# Patient Record
Sex: Female | Born: 1955 | Race: White | Hispanic: No | Smoking: Former smoker
Health system: Southern US, Community
[De-identification: ages and names within clinical notes are randomized; demographics above are authoritative.]

## PROBLEM LIST (undated history)

## (undated) DIAGNOSIS — T7840XA Allergy, unspecified, initial encounter: Secondary | ICD-10-CM

## (undated) DIAGNOSIS — F5081 Binge eating disorder: Secondary | ICD-10-CM

## (undated) DIAGNOSIS — J4520 Mild intermittent asthma, uncomplicated: Secondary | ICD-10-CM

## (undated) DIAGNOSIS — R7301 Impaired fasting glucose: Secondary | ICD-10-CM

## (undated) DIAGNOSIS — D3500 Benign neoplasm of unspecified adrenal gland: Secondary | ICD-10-CM

## (undated) DIAGNOSIS — F50819 Binge eating disorder, unspecified: Secondary | ICD-10-CM

## (undated) DIAGNOSIS — R112 Nausea with vomiting, unspecified: Secondary | ICD-10-CM

## (undated) DIAGNOSIS — C439 Malignant melanoma of skin, unspecified: Secondary | ICD-10-CM

## (undated) DIAGNOSIS — I10 Essential (primary) hypertension: Secondary | ICD-10-CM

## (undated) DIAGNOSIS — Z860100 Personal history of colon polyps, unspecified: Secondary | ICD-10-CM

## (undated) DIAGNOSIS — F429 Obsessive-compulsive disorder, unspecified: Secondary | ICD-10-CM

## (undated) DIAGNOSIS — I2721 Secondary pulmonary arterial hypertension: Secondary | ICD-10-CM

## (undated) DIAGNOSIS — T753XXA Motion sickness, initial encounter: Secondary | ICD-10-CM

## (undated) DIAGNOSIS — E785 Hyperlipidemia, unspecified: Secondary | ICD-10-CM

## (undated) DIAGNOSIS — J45909 Unspecified asthma, uncomplicated: Secondary | ICD-10-CM

## (undated) DIAGNOSIS — Z9889 Other specified postprocedural states: Secondary | ICD-10-CM

## (undated) DIAGNOSIS — Z780 Asymptomatic menopausal state: Secondary | ICD-10-CM

## (undated) DIAGNOSIS — G4700 Insomnia, unspecified: Secondary | ICD-10-CM

## (undated) DIAGNOSIS — R600 Localized edema: Secondary | ICD-10-CM

## (undated) DIAGNOSIS — Z973 Presence of spectacles and contact lenses: Secondary | ICD-10-CM

## (undated) DIAGNOSIS — K760 Fatty (change of) liver, not elsewhere classified: Secondary | ICD-10-CM

## (undated) DIAGNOSIS — Z87898 Personal history of other specified conditions: Secondary | ICD-10-CM

## (undated) HISTORY — DX: Obsessive-compulsive disorder, unspecified: F42.9

## (undated) HISTORY — PX: MELANOMA EXCISION: SHX5266

## (undated) HISTORY — DX: Insomnia, unspecified: G47.00

## (undated) HISTORY — DX: Hyperlipidemia, unspecified: E78.5

## (undated) HISTORY — PX: CYSTECTOMY: SUR359

## (undated) HISTORY — DX: Binge eating disorder, unspecified: F50.819

## (undated) HISTORY — DX: Personal history of other specified conditions: Z87.898

## (undated) HISTORY — DX: Malignant melanoma of skin, unspecified: C43.9

## (undated) HISTORY — DX: Binge eating disorder: F50.81

## (undated) HISTORY — DX: Unspecified asthma, uncomplicated: J45.909

## (undated) HISTORY — DX: Personal history of colonic polyps: Z86.010

## (undated) HISTORY — DX: Fatty (change of) liver, not elsewhere classified: K76.0

## (undated) HISTORY — DX: Impaired fasting glucose: R73.01

## (undated) HISTORY — DX: Secondary pulmonary arterial hypertension: I27.21

## (undated) HISTORY — DX: Allergy, unspecified, initial encounter: T78.40XA

## (undated) HISTORY — DX: Personal history of colon polyps, unspecified: Z86.0100

## (undated) HISTORY — DX: Mild intermittent asthma, uncomplicated: J45.20

## (undated) HISTORY — DX: Asymptomatic menopausal state: Z78.0

## (undated) HISTORY — PX: DIAGNOSTIC LAPAROSCOPY: SUR761

## (undated) HISTORY — DX: Benign neoplasm of unspecified adrenal gland: D35.00

---

## 2008-08-16 HISTORY — PX: BREAST BIOPSY: SHX20

## 2010-06-24 ENCOUNTER — Observation Stay: Payer: Self-pay | Admitting: Internal Medicine

## 2010-06-24 ENCOUNTER — Ambulatory Visit: Payer: Self-pay | Admitting: Internal Medicine

## 2011-08-12 ENCOUNTER — Ambulatory Visit: Payer: Self-pay | Admitting: Family Medicine

## 2011-08-17 HISTORY — PX: CHOLECYSTECTOMY: SHX55

## 2011-10-06 ENCOUNTER — Ambulatory Visit: Payer: Self-pay | Admitting: General Surgery

## 2011-10-07 LAB — PATHOLOGY REPORT

## 2011-10-11 ENCOUNTER — Ambulatory Visit: Payer: Self-pay | Admitting: General Surgery

## 2012-06-16 ENCOUNTER — Ambulatory Visit: Payer: Self-pay | Admitting: Unknown Physician Specialty

## 2012-06-16 HISTORY — PX: COLONOSCOPY: SHX174

## 2012-06-19 LAB — PATHOLOGY REPORT

## 2012-06-23 LAB — HM COLONOSCOPY

## 2012-08-06 ENCOUNTER — Ambulatory Visit: Payer: Self-pay | Admitting: Internal Medicine

## 2012-08-11 ENCOUNTER — Ambulatory Visit: Payer: Self-pay | Admitting: Family Medicine

## 2012-09-15 ENCOUNTER — Ambulatory Visit: Payer: Self-pay | Admitting: Family Medicine

## 2013-07-18 ENCOUNTER — Ambulatory Visit: Payer: Self-pay | Admitting: Family Medicine

## 2014-05-24 ENCOUNTER — Ambulatory Visit: Payer: Self-pay | Admitting: Family Medicine

## 2014-09-25 ENCOUNTER — Observation Stay: Payer: Self-pay | Admitting: Internal Medicine

## 2014-12-08 NOTE — Op Note (Signed)
PATIENT NAME:  Julie Ayala, Julie Ayala MR#:  [id number] DATE OF BIRTH:  [birth year]  DATE OF PROCEDURE:  10/06/2011  PREOPERATIVE DIAGNOSIS: Chronic cholecystitis and cholelithiasis.   POSTOPERATIVE DIAGNOSIS: Chronic cholecystitis and cholelithiasis.   OPERATIVE PROCEDURE: Laparoscopic cholecystectomy with intraoperative cholangiograms.   SURGEON: Robert Bellow, MD  ANESTHESIA: General endotracheal under Dr. Boston Service.   ESTIMATED BLOOD LOSS: Less than 5 mL.   CLINICAL NOTE: This 59 year old woman has had recurrent episodes of abdominal pain and ultrasound shows evidence of cholelithiasis. She was felt to be a candidate for elective cholecystectomy.   DESCRIPTION OF PROCEDURE: With the patient under adequate general endotracheal anesthesia, the abdomen was prepped with ChloraPrep and draped. In Trendelenburg position, a Veress needle was placed through a transumbilical incision. After assuring intra-abdominal location with the hanging drop test, the abdomen was insufflated with CO2 at 10 mmHg pressure. A 10 mm step port was expanded and inspection showed no evidence of injury from initial port placement. With the patient in reverse Trendelenburg position and rolled to the left an 11 mm Xcel port was placed in the epigastrium and two 5 mm step ports placed on the right side of the abdomen. There was noted to be chronic scarring about the gallbladder. The gallbladder was placed on cephalad traction and the adhesions between the omentum and the gallbladder were taken down with cautery dissection. The neck of the gallbladder was cleared and cystic duct and cystic artery were identified. A Kumar clamp was placed and 40 mL of one-half strength Conray 60 was used for cholangiograms. There was a small cystic duct and slow trickle into the common hepatic duct showing the bifurcation and then visualization of the distal common bile duct and flow into the duodenum. The films were suboptimal but there was little  suspicion for stone disease and with free flow and visualization of both the hepatic and common bile duct further attempts at cholangiography was not taken. The cystic duct and cystic artery branches were divided with clips and the gallbladder removed from the liver bed making use of hook cautery dissection. This was then delivered through the umbilical port site. Inspection from the epigastric site showed no evidence of injury from initial port placement. The right upper quadrant was irrigated with lactated Ringer's solution. Final inspection showed good hemostasis. The abdomen was then desufflated and ports removed under direct vision.   Skin incisions were closed with 4-0 Vicryl subcuticular sutures. Benzoin, Steri-Strips, Telfa, and Tegaderm dressings were applied.   Patient tolerated procedure well and was taken to recovery room in stable condition.   ____________________________ Robert Bellow, MD jwb:cms D: 10/06/2011 08:50:37 ET T: 10/06/2011 09:07:47 ET JOB#: [id number]  cc: Robert Bellow, MD, <Dictator> Olene Craven. Owens Shark, DO Timm Bonenberger Amedeo Kinsman MD ELECTRONICALLY SIGNED 10/06/2011 13:06

## 2014-12-15 NOTE — H&P (Signed)
PATIENT NAME:  Julie Ayala, Julie Ayala MR#:  [id number] DATE OF BIRTH:  April 08, 1956  DATE OF ADMISSION:  09/24/2014  PRIMARY CARE PROVIDER: [facility].   CHIEF COMPLAINT: Chest pain.    HISTORY OF PRESENT ILLNESS: A 59 year old female patient with history of asthma presents to the Emergency Room complaining of left-sided chest pain radiating to the left shoulder. This started yesterday around 11:30 p.m., which woke her up from sleep. She was unable to sleep well. Today, it got worse when she parked her car and climbed around 50 stairs. The patient presents to the Emergency Room. Presently, her pain is almost resolved. She had some tingling and numbness of her left hand briefly, which has resolved. EKG looks normal. Troponin is normal. She does not have any tachycardia. No hypoxia. Had some mild nausea, but no vomiting. No sweating.   The patient mentions that she was supposed to be started on medication for her binge eating by her family provider. For this an EKG was done which showed some abnormality for which it was repeated the next day and this abnormality persisted. The patient has a followup appointment with Dr. Clayborn Bigness on 09/27/2014. Today in the Emergency Room, the patient's EKG is normal. The patient did have a stress test in 2011, which was normal.   PAST MEDICAL HISTORY:  1.  Asthma.  2.  Pancreatitis.  3.  Cholecystectomy.  4.  Melanoma resection.   FAMILY HISTORY: Hypertension and pacemaker placement in her mother. No premature CAD.    SOCIAL HISTORY: The patient does not smoke. No alcohol. No illicit drug use. Works at [employer]: [place].   HOME MEDICATIONS: Albuterol inhaler q. 4 hours as needed for asthma.   REVIEW OF SYSTEMS:  CONSTITUTIONAL: No fever, fatigue.  EYES: No blurred vision, pain or redness.  EAR, NOSE, AND THROAT: No tinnitus, ear pain, hearing loss.  RESPIRATORY: No cough, wheeze, hemoptysis. Has asthma.  CARDIOVASCULAR: Had  chest pain.  GASTROINTESTINAL: No nausea, vomiting, diarrhea, abdominal pain.  GENITOURINARY: No dysuria, hematuria, frequency.  ENDOCRINE: No polyuria, nocturia, thyroid problems.  HEMATOLOGIC AND LYMPHATIC: No anemia, easy bruising, bleeding.  INTEGUMENTARY: No acne, rash, lesion.  MUSCULOSKELETAL: No back pain, arthritis.  NEUROLOGIC: No focal numbness, weakness.  PSYCHIATRIC: Has some anxiety.   PHYSICAL EXAMINATION:  VITAL SIGNS: Shows temperature 98. Initially, blood pressure was elevated at 164/91, presently at 115/67; saturating 97% on room air and pulse 72.  GENERAL: Obese Caucasian female patient lying in bed, overall seems comfortable, conversational, cooperative with exam.  PSYCHIATRIC: Alert and oriented x 3. Mood and affect appropriate. Judgment intact.  HEENT: Atraumatic, normocephalic. Oral mucosa moist and pink. External ears and nose normal. No pallor. No icterus. Pupils bilaterally equal and reactive to light.  NECK: Supple. No thyromegaly palpable. Trachea midline. No JVD.  CARDIOVASCULAR: S1, S2, without any murmurs. Peripheral pulses 2+. No edema. No chest wall tenderness.  GASTROINTESTINAL: Soft abdomen, nontender. Bowel sounds present. SKIN: Warm and dry. No petechiae, rash, ulcers.  MUSCULOSKELETAL: No joint swelling, redness, effusion of the large joints. Normal muscle tone.  NEUROLOGICAL: Motor strength 5/5 in upper and lower extremities. Sensation to fine touch intact all over.  LYMPHATIC: No cervical lymphadenopathy.   LABORATORY STUDIES: Troponin less than 0.02. D-dimer pending. Glucose 127, BUN 17, creatinine 0.95. WBC 7.2, hemoglobin 14.2, platelets of 301,000.   EKG shows normal sinus rhythm, nothing acute.   IMAGING: Chest x-ray, PA and lateral, shows no acute cardiopulmonary disease.  ASSESSMENT AND PLAN:  1.  Chest pain radiating to the left shoulder in a patient with no risk factors. The patient did have some abnormality of EKG, per her history at  her primary care physician's office, presently EKG seems normal. We will admit the patient for a stress test tomorrow. Depending on the stress just results, we might have to consult Dr. Clayborn Bigness or she can follow up in his office on 09/27/2014. Check 2 more sets of cardiac enzymes. Nitroglycerin p.r.n. The patient's symptoms could also be gastroesophageal reflux disease if her stress test is negative.  2.  Asthma: Continue her inhalers. No wheezing on exam.  3.  Deep vein thrombosis prophylaxis: Ambulatory, sequential compression devices.   CODE STATUS: Full code.   TIME SPENT TODAY ON THIS CASE: 45 minutes.    ____________________________ Leia Alf Babette Stum, MD srs:bm D: 09/25/2014 00:35:21 ET T: 09/25/2014 00:48:35 ET JOB#: [id number]  cc: Alveta Heimlich R. Pawan Knechtel, MD, <Dictator> Dwayne D. Clayborn Bigness, MD [place] MD ELECTRONICALLY SIGNED 09/25/2014 6:15

## 2014-12-15 NOTE — Discharge Summary (Signed)
PATIENT NAME:  Julie Ayala, Julie Ayala MR#:  [id number] DATE OF BIRTH:  1956-05-30  DATE OF ADMISSION:  09/25/2014 DATE OF DISCHARGE:  09/25/2014  PRESENTING COMPLAINT: Chest pain.   DISCHARGE DIAGNOSIS:  Chest pain, appears atypical.   PROCEDURES: Myoview stress test appears essentially within normal limits. The patient has ejection fraction of 69%. No EKG changes concerning with ischemia. Cardiac enzymes x 3 were negative. CBC and basic metabolic panel within normal limits.   HOSPITAL COURSE:  Julie Ayala is a 59 year old, obese, Caucasian female with past medical history of asthma, came in with several weeks of chest pain and pain in the left shoulder area as well. She was admitted with:  1.  Chest pain with no risk factors. The patient did have normal EKG.  Her cardiac enzymes were negative. No gastroesophageal reflux disease symptoms. She remained in sinus rhythm.  Her Myoview stress test was negative.  2.  Asthma. Continue inhalers.  3.  Deep vein thrombosis prophylaxis. The patient is ambulatory.  4.  Hospital stay otherwise remained stable.   CODE STATUS: The patient remained a FULL CODE.   TIME SPENT: 40 minutes.     ____________________________ Hart Rochester Posey Pronto, MD sap:DT D: 09/26/2014 13:00:47 ET T: 09/26/2014 17:26:41 ET JOB#: [id number]  cc: Andron Marrazzo A. Posey Pronto, MD, <Dictator> Ilda Basset MD ELECTRONICALLY SIGNED 10/15/2014 17:28

## 2015-01-21 ENCOUNTER — Other Ambulatory Visit: Payer: Self-pay | Admitting: Family Medicine

## 2015-03-13 DIAGNOSIS — M5430 Sciatica, unspecified side: Secondary | ICD-10-CM | POA: Insufficient documentation

## 2015-03-13 DIAGNOSIS — F429 Obsessive-compulsive disorder, unspecified: Secondary | ICD-10-CM | POA: Insufficient documentation

## 2015-03-13 DIAGNOSIS — E785 Hyperlipidemia, unspecified: Secondary | ICD-10-CM | POA: Insufficient documentation

## 2015-03-13 DIAGNOSIS — R7301 Impaired fasting glucose: Secondary | ICD-10-CM | POA: Insufficient documentation

## 2015-03-13 DIAGNOSIS — G4700 Insomnia, unspecified: Secondary | ICD-10-CM | POA: Insufficient documentation

## 2015-03-13 DIAGNOSIS — F5081 Binge eating disorder: Secondary | ICD-10-CM | POA: Insufficient documentation

## 2015-03-13 DIAGNOSIS — C439 Malignant melanoma of skin, unspecified: Secondary | ICD-10-CM | POA: Insufficient documentation

## 2015-03-13 DIAGNOSIS — T7840XA Allergy, unspecified, initial encounter: Secondary | ICD-10-CM | POA: Insufficient documentation

## 2015-03-13 DIAGNOSIS — F50819 Binge eating disorder, unspecified: Secondary | ICD-10-CM | POA: Insufficient documentation

## 2015-03-14 ENCOUNTER — Encounter: Payer: Self-pay | Admitting: Family Medicine

## 2015-03-14 ENCOUNTER — Ambulatory Visit (INDEPENDENT_AMBULATORY_CARE_PROVIDER_SITE_OTHER): Payer: 59 | Admitting: Family Medicine

## 2015-03-14 VITALS — BP 128/81 | HR 73 | Temp 99.1°F | Wt 194.0 lb

## 2015-03-14 DIAGNOSIS — F429 Obsessive-compulsive disorder, unspecified: Secondary | ICD-10-CM

## 2015-03-14 DIAGNOSIS — C439 Malignant melanoma of skin, unspecified: Secondary | ICD-10-CM

## 2015-03-14 DIAGNOSIS — F42 Obsessive-compulsive disorder: Secondary | ICD-10-CM

## 2015-03-14 DIAGNOSIS — F508 Other eating disorders: Secondary | ICD-10-CM | POA: Diagnosis not present

## 2015-03-14 DIAGNOSIS — F5081 Binge eating disorder: Secondary | ICD-10-CM

## 2015-03-14 DIAGNOSIS — F50819 Binge eating disorder, unspecified: Secondary | ICD-10-CM

## 2015-03-14 MED ORDER — FLUOXETINE HCL 10 MG PO TABS: 10.0000 mg | ORAL_TABLET | Freq: Every day | ORAL | Status: DC

## 2015-03-14 NOTE — Assessment & Plan Note (Signed)
resected and pt followed by derm

## 2015-03-14 NOTE — Patient Instructions (Signed)
Let's increase your fluoxetine from 40 mg to 50 mg daily Call me in 3-4 weeks with an update Return in 4 months for follow-up

## 2015-03-14 NOTE — Progress Notes (Signed)
   BP 128/81 mmHg  Pulse 73  Temp(Src) 99.1 F (37.3 C)  Wt 194 lb (87.998 kg)  SpO2 96%   Subjective:    Patient ID: Julie Ayala, female    DOB: 22-Jun-1956, 59 y.o.   MRN: [id number]  HPI: Julie Ayala is a 59 y.o. female  Chief Complaint  Patient presents with  . Other    follow up on Fluoxetine   She is "doing good" on her medicine; taking 40 mg now; no nausea; no headaches, no elevated mood She got a call back about her sleep and has cut out the electronics before bed Goes to bed at 10 pm, no problems falling asleep, then wakes up around 3:30 am, quickly goes back to sleep, but then gets up several times before she is finally up; tosses and turns the last few hours of night She has always woken up early; no kicking in the sleep; no leg pain in the morning; no bladder issues; no night sweats; no am sore throats  She has been seen by dermatologist for f/u; had two more removed but benign; soon will go to every six months after next visit  Relevant past medical, surgical, family and social history reviewed and updated as indicated. Interim medical history since our last visit reviewed. Allergies and medications reviewed and updated.  Review of Systems  Per HPI unless specifically indicated above     Objective:    BP 128/81 mmHg  Pulse 73  Temp(Src) 99.1 F (37.3 C)  Wt 194 lb (87.998 kg)  SpO2 96%  Wt Readings from Last 3 Encounters:  03/14/15 194 lb (87.998 kg)  11/27/14 196 lb (88.905 kg)    Physical Exam  Constitutional: She appears well-developed and well-nourished. No distress.  Eyes: EOM are normal. No scleral icterus.  Neck: No thyromegaly present.  Cardiovascular: Normal rate and regular rhythm.   Pulmonary/Chest: Effort normal and breath sounds normal.  Abdominal: She exhibits no distension.  Skin: No pallor.  Psychiatric: She has a normal mood and affect. Her behavior is normal. Judgment and thought content normal.    Results for orders placed or  performed in visit on 03/13/15  HM COLONOSCOPY  Result Value Ref Range   HM Colonoscopy small polyps, repeat in Nov. 2017       Assessment & Plan:   Problem List Items Addressed This Visit      Musculoskeletal and Integument   Clark level III melanoma    resected and pt followed by derm      Relevant Medications   ibuprofen (ADVIL,MOTRIN) 600 MG tablet     Other   OCD (obsessive compulsive disorder)    Increase SSRI form 40 mg to 50 mg every day; call me with response after 3-4 weeks      Binge eating disorder - Primary    Continue SSRI but increase from 40 mg to 50 mg          Follow up plan: Return in about 4 months (around 07/15/2015) for asthma.

## 2015-03-14 NOTE — Assessment & Plan Note (Signed)
Increase SSRI form 40 mg to 50 mg every day; call me with response after 3-4 weeks

## 2015-03-14 NOTE — Assessment & Plan Note (Signed)
Continue SSRI but increase from 40 mg to 50 mg

## 2015-05-19 ENCOUNTER — Telehealth: Payer: Self-pay

## 2015-05-19 NOTE — Telephone Encounter (Signed)
Patient called to come in for a blood pressure check under a nurse. She understood to come in at 2:45. I was later explained to that we no longer do blood pressure checks because if it isn't in range, they have to be seen. I called patient back to tell her to schedule an appointment with Dr. Sanda Klein for her BP to be rechecked. She did not answer and I left a message for her to call us back about her appointment and blood pressure.

## 2015-05-26 ENCOUNTER — Other Ambulatory Visit: Payer: Self-pay | Admitting: Family Medicine

## 2015-05-26 MED ORDER — ALBUTEROL SULFATE HFA 108 (90 BASE) MCG/ACT IN AERS: 2.0000 | INHALATION_SPRAY | RESPIRATORY_TRACT | Status: DC | PRN

## 2015-05-26 NOTE — Telephone Encounter (Signed)
Routing to provider  

## 2015-05-26 NOTE — Telephone Encounter (Signed)
rx approved

## 2015-05-26 NOTE — Telephone Encounter (Signed)
Refill on albuterol sent to [facility]

## 2015-07-18 ENCOUNTER — Encounter: Payer: Self-pay | Admitting: Family Medicine

## 2015-07-18 ENCOUNTER — Ambulatory Visit (INDEPENDENT_AMBULATORY_CARE_PROVIDER_SITE_OTHER): Payer: 59 | Admitting: Family Medicine

## 2015-07-18 VITALS — BP 136/75 | HR 59 | Temp 97.6°F | Ht 65.0 in | Wt 189.0 lb

## 2015-07-18 DIAGNOSIS — Z23 Encounter for immunization: Secondary | ICD-10-CM

## 2015-07-18 DIAGNOSIS — F429 Obsessive-compulsive disorder, unspecified: Secondary | ICD-10-CM

## 2015-07-18 DIAGNOSIS — F5081 Binge eating disorder: Secondary | ICD-10-CM | POA: Diagnosis not present

## 2015-07-18 DIAGNOSIS — J4540 Moderate persistent asthma, uncomplicated: Secondary | ICD-10-CM

## 2015-07-18 MED ORDER — MONTELUKAST SODIUM 10 MG PO TABS: 10.0000 mg | ORAL_TABLET | Freq: Every day | ORAL | Status: DC

## 2015-07-18 MED ORDER — PREDNISONE 10 MG PO TABS: 30.0000 mg | ORAL_TABLET | Freq: Every day | ORAL | Status: DC

## 2015-07-18 NOTE — Progress Notes (Signed)
BP 136/75 mmHg  Pulse 59  Temp(Src) 97.6 F (36.4 C)  Ht 5\' 5"  (1.651 m)  Wt 189 lb (85.73 kg)  BMI 31.45 kg/m2  SpO2 99%   Subjective:    Patient ID: Julie Ayala, female    DOB: [birth year], 59 y.o.   MRN: [id number]  HPI: Julie Ayala is a 59 y.o. female  Chief Complaint  Patient presents with  . Asthma  . Medication Refill    also needs a refill on Ibuprofen   She has really been struggling with her asthma Lots of cough, wheezing over the last few weeks Chest is tight Using rescue inhaler 4-5 x a day for the last 3 weeks Really changed in the fall with heat coming on She has gas heat keeps at 65 degrees at home Work environment is around 74 degrees, but the air in the office is really dry; they are going to get a humidifier, corporate policy requires forms for humidifier (brought in today, but lengthy, so those will be filled out during administrative time next week) May need leave paperwork for intermittent asthma flares, okay to bring in [facility] paperwork No other URI symptoms (ear problems, rhinorrhea, sore throat, rash, etc.)  Flu shot today  She has binge-eating disorder and OCD; she says her Prozac is working well  Relevant past medical, surgical, family and social history reviewed and updated as indicated.  Past Medical History  Diagnosis Date  . Hyperlipidemia   . Sciatica   . OCD (obsessive compulsive disorder)   . Binge eating disorder   . Asthma   . Allergy   . IFG (impaired fasting glucose)   . Insomnia   . 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[place])   . History of chest pain     nuclear med stress test Sep 25, 2014 negative, EF 69%  . Post-menopausal    Past Surgical History  Procedure Laterality Date  . Cholecystectomy  2013  . Cystectomy      uterine cyst removed  . Melanoma excision    . Cystectomy      Hyoid cyst removed  . Colonoscopy  06/2012    small polyps, repeat in 06/2016   Social History   Social History  . Marital Status: Divorced   Spouse Name: N/A  . Number of Children: N/A  . Years of Education: N/A   Social History Main Topics  . Smoking status: Former Smoker -- .5 years    Types: Cigarettes    Quit date: 03/14/1987  . Smokeless tobacco: Never Used  . Alcohol Use: No  . Drug Use: No  . Sexual Activity: Not Asked   Other Topics Concern  . None   Social History Narrative   Interim medical history since our last visit reviewed. none Allergies and medications reviewed and updated.  Review of Systems Per HPI unless specifically indicated above     Objective:    BP 136/75 mmHg  Pulse 59  Temp(Src) 97.6 F (36.4 C)  Ht 5\' 5"  (1.651 m)  Wt 189 lb (85.73 kg)  BMI 31.45 kg/m2  SpO2 99%  Wt Readings from Last 3 Encounters:  07/18/15 189 lb (85.73 kg)  03/14/15 194 lb (87.998 kg)  11/27/14 196 lb (88.905 kg)    Physical Exam  Constitutional: She appears well-developed and well-nourished. No distress.  Obese, but five pounds weight loss since last visit, and seven pounds down over last 8-1/2 months  Eyes: EOM are normal. Right eye exhibits no discharge. Left eye exhibits  no discharge. Right conjunctiva is not injected. Left conjunctiva is not injected. No scleral icterus.  Neck: No JVD present. No thyromegaly present.  Cardiovascular: Regular rhythm.   No extrasystoles are present. Bradycardia present.   Pulmonary/Chest: Effort normal and breath sounds normal. No accessory muscle usage. No respiratory distress. She has no decreased breath sounds. She has no wheezes. She has no rhonchi. She has no rales.  Abdominal: She exhibits no distension.  Musculoskeletal: She exhibits no edema.  Lymphadenopathy:    She has no cervical adenopathy.  Skin: No pallor.  Psychiatric: She has a normal mood and affect. Her behavior is normal. Judgment and thought content normal.   Results for orders placed or performed in visit on 03/13/15  HM COLONOSCOPY  Result Value Ref Range   HM Colonoscopy small polyps, repeat  in Nov. 2017       Assessment & Plan:   Problem List Items Addressed This Visit      Respiratory   Asthma, moderate persistent, poorly-controlled - Primary    Will fill out forms for humidification at work, as she believes that will help, and flare started when air got so dry at work; will add singulair (montelukast) to current regimen; continue LABA/steroid combination, and SABA when needed; will use a short steroid taper at lower dose (lower dose, since lungs sound good today and I don't want to worsen her binge eating disorder); call if needed      Relevant Medications   predniSONE (DELTASONE) 10 MG tablet   montelukast (SINGULAIR) 10 MG tablet     Other   OCD (obsessive compulsive disorder)    Doing well on Prozac; continue same dose      Binge eating disorder    Doing well on Prozac; continue same dose; glad to see she has managed to lose 7 pounds over last 8+ months, but I did not bring up weight today and did not want to focus on that       Other Visit Diagnoses    Needs flu shot        flu vaccine given today; see AVS    Encounter for immunization          Follow up plan: Return if symptoms worsen or fail to improve.  Meds ordered this encounter  Medications  . predniSONE (DELTASONE) 10 MG tablet    Sig: Take 3 tablets (30 mg total) by mouth daily with breakfast.    Dispense:  15 tablet    Refill:  0  . montelukast (SINGULAIR) 10 MG tablet    Sig: Take 1 tablet (10 mg total) by mouth at bedtime.    Dispense:  30 tablet    Refill:  3  . ibuprofen (ADVIL,MOTRIN) 600 MG tablet    Sig: Take 1 tablet (600 mg total) by mouth every 8 (eight) hours as needed.    Dispense:  30 tablet    Refill:  0  . FLUoxetine (PROZAC) 40 MG capsule    Sig: Take 1 capsule (40 mg total) by mouth daily.    Dispense:  90 capsule    Refill:  1   Orders Placed This Encounter  Procedures  . Flu Vaccine QUAD 36+ mos IM   An after-visit summary was printed and given to the patient at  [place].  Please see the patient instructions which may contain other information and recommendations beyond what is mentioned above in the assessment and plan.

## 2015-07-18 NOTE — Patient Instructions (Addendum)
Start the new medicine montelukast (Singulair) and take daily during weeks and months of flares Start the prednisone and take daily for five days (with food) You received the flu shot today; it should protect you against the flu virus over the coming months; it will take about two weeks for antibodies to develop; do try to stay away from hospitals, nursing homes, and daycares during peak flu season; taking vitamin C daily during flu season may help you avoid getting sick

## 2015-07-19 DIAGNOSIS — J4520 Mild intermittent asthma, uncomplicated: Secondary | ICD-10-CM | POA: Insufficient documentation

## 2015-07-19 HISTORY — DX: Mild intermittent asthma, uncomplicated: J45.20

## 2015-07-19 MED ORDER — FLUOXETINE HCL 40 MG PO CAPS: 40.0000 mg | ORAL_CAPSULE | Freq: Every day | ORAL | Status: DC

## 2015-07-19 MED ORDER — IBUPROFEN 600 MG PO TABS: 600.0000 mg | ORAL_TABLET | Freq: Three times a day (TID) | ORAL | Status: DC | PRN

## 2015-07-19 NOTE — Assessment & Plan Note (Signed)
Doing well on Prozac; continue same dose; glad to see she has managed to lose 7 pounds over last 8+ months, but I did not bring up weight today and did not want to focus on that

## 2015-07-19 NOTE — Assessment & Plan Note (Signed)
Doing well on Prozac; continue same dose

## 2015-07-19 NOTE — Assessment & Plan Note (Signed)
Will fill out forms for humidification at work, as she believes that will help, and flare started when air got so dry at work; will add singulair (montelukast) to current regimen; continue LABA/steroid combination, and SABA when needed; will use a short steroid taper at lower dose (lower dose, since lungs sound good today and I don't want to worsen her binge eating disorder); call if needed

## 2015-07-30 ENCOUNTER — Telehealth: Payer: Self-pay

## 2015-07-30 NOTE — Telephone Encounter (Signed)
She would like to know if you've had a chance to complete her forms for her to get a humidifier in her office?

## 2015-08-04 NOTE — Telephone Encounter (Signed)
Patient notified forms are completed, she asked that I mail her the originals.

## 2015-08-04 NOTE — Telephone Encounter (Signed)
Form is ready; needs to be faxed back today

## 2015-08-04 NOTE — Telephone Encounter (Signed)
I faxed her forms to the [employer].

## 2015-09-18 ENCOUNTER — Other Ambulatory Visit: Payer: Self-pay | Admitting: Family Medicine

## 2015-09-18 NOTE — Telephone Encounter (Signed)
Rx approved; she should have enough of the 10 mg to last until July

## 2015-11-19 ENCOUNTER — Ambulatory Visit (INDEPENDENT_AMBULATORY_CARE_PROVIDER_SITE_OTHER): Payer: 59 | Admitting: Family Medicine

## 2015-11-19 ENCOUNTER — Encounter: Payer: Self-pay | Admitting: Family Medicine

## 2015-11-19 VITALS — BP 134/84 | HR 71 | Temp 98.1°F | Resp 16 | Wt 200.8 lb

## 2015-11-19 DIAGNOSIS — J4540 Moderate persistent asthma, uncomplicated: Secondary | ICD-10-CM

## 2015-11-19 DIAGNOSIS — R197 Diarrhea, unspecified: Secondary | ICD-10-CM

## 2015-11-19 MED ORDER — PREDNISONE 10 MG PO TABS: 30.0000 mg | ORAL_TABLET | Freq: Every day | ORAL | Status: DC

## 2015-11-19 NOTE — Progress Notes (Signed)
   11/19/15 1005  Asthma History  Symptoms Daily  Nighttime Awakenings Often--7/wk  Asthma interference with normal activity Extreme limitations  SABA use (not for EIB) Several times/day  Risk: Exacerbations requiring oral systemic steroids 2 or more / year  Asthma Severity Moderate Persistent

## 2015-11-19 NOTE — Progress Notes (Signed)
Asthma Name: Julie Ayala   MRN: [id number]    DOB: Feb 08, 1956   Date:11/19/2015       Progress Note  Subjective  Chief Complaint  Chief Complaint  Patient presents with  . Asthma    patient stated that she has been bothered with asthma for the past 3 weeks. nonresponsive to her inhalers or the steroids. patient stated that her airway is irritated. patient has a hx of reactive airway and stated that from Jan - April is pretty hard for her.  . Cough  . Diarrhea    HPI  Asthma Moderate Persistent: worse over the past 2 weeks , not triggered URI, but by change in temperature, has been using rescue inhaler more often and it causes diarrhea, she states explosive diarrhea and inability to go to work since yesterday. Coughing a lot and having wheezing.     11/19/15 1005  Asthma History  Symptoms Daily  Nighttime Awakenings Often--7/wk  Asthma interference with normal activity Extreme limitations  SABA use (not for EIB) Several times/day  Risk: Exacerbations requiring oral systemic steroids 2 or more / year  Asthma Severity Moderate Persistent   Patient Active Problem List   Diagnosis Date Noted  . Asthma, moderate persistent, poorly-controlled 07/19/2015  . Hyperlipidemia   . Sciatica   . OCD (obsessive compulsive disorder)   . Binge eating disorder   . Allergy   . IFG (impaired fasting glucose)   . Insomnia   . 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 Cholecystectomy  2013  . Cystectomy      uterine cyst removed  . Melanoma excision    . Cystectomy      Hyoid cyst removed  . Colonoscopy  06/2012    small polyps, repeat in 06/2016    Family History  Problem Relation Age of Onset  . Hypertension Mother   . Heart disease Mother     pacemaker  . Congestive Heart Failure Mother     with pacemaker  . Stroke Mother     TIA/CVA  . Cancer Father     colon and lung  . Hypertension Sister   . Mental illness Sister     anxiety   . Hypothyroidism Sister   . Heart disease Brother     unknown heart condition name, EF 55%  . Hypertension Brother   . Cancer Son     melanoma  . Diabetes Paternal Aunt   . Diabetes Paternal Uncle   . Hypertension Brother   . Heart Problems Son     prolonged QT syndrome (syncope)  . Stroke Maternal Grandmother   . COPD Neg Hx     Social History   Social History  . Marital Status: Divorced    Spouse Name: N/A  . Number of Children: N/A  . Years of Education: N/A   Occupational History  . Not on file.   Social History Main Topics  . Smoking status: Former Smoker -- .5 years    Types: Cigarettes    Quit date: 03/14/1987  . Smokeless tobacco: Never Used  . Alcohol Use: No  . Drug Use: No  . Sexual Activity: Not on file   Other Topics Concern  . Not on file   Social History Narrative     Current outpatient prescriptions:  .  albuterol (PROVENTIL HFA;VENTOLIN HFA) 108 (90 BASE) MCG/ACT inhaler, Inhale 2 puffs into the lungs every 4 (four) hours as needed  for wheezing or shortness of breath., Disp: 1 Inhaler, Rfl: 1 .  budesonide-formoterol (SYMBICORT) 160-4.5 MCG/ACT inhaler, Inhale into the lungs., Disp: , Rfl:  .  FLUoxetine (PROZAC) 10 MG tablet, Take 1 tablet (10 mg total) by mouth daily. (total of 50 mg daily), Disp: 90 tablet, Rfl: 3 .  FLUoxetine (PROZAC) 40 MG capsule, Take 1 capsule by mouth  daily, Disp: 90 capsule, Rfl: 1 .  ibuprofen (ADVIL,MOTRIN) 600 MG tablet, Take 1 tablet (600 mg total) by mouth every 8 (eight) hours as needed., Disp: 30 tablet, Rfl: 0 .  montelukast (SINGULAIR) 10 MG tablet, Take 1 tablet (10 mg total) by mouth at bedtime., Disp: 30 tablet, Rfl: 3 .  predniSONE (DELTASONE) 10 MG tablet, Take 3 tablets (30 mg total) by mouth daily with breakfast., Disp: 15 tablet, Rfl: 0  Allergies  Allergen Reactions  . Codeine Diarrhea and Nausea And Vomiting  . Latex   . Pravastatin Other (See Comments)    myalgias  . Sulfa Antibiotics Rash      ROS  Ten systems reviewed and is negative except as mentioned in HPI   Objective  Filed Vitals:   11/19/15 0933  BP: 134/84  Pulse: 71  Temp: 98.1 F (36.7 C)  TempSrc: Oral  Resp: 16  Weight: 200 lb 12.8 oz (91.082 kg)  SpO2: 97%    Body mass index is 33.41 kg/(m^2).  Physical Exam  Constitutional: Patient appears well-developed and well-nourished. Obese No distress.  HEENT: head atraumatic, normocephalic, pupils equal and reactive to light,  neck supple, throat within normal limits Cardiovascular: Normal rate, regular rhythm and normal heart sounds.  No murmur heard. No BLE edema. Pulmonary/Chest: Effort normal scattered rhonchi. No respiratory distress. Abdominal: Soft.  There is no tenderness. Psychiatric: Patient has a normal mood and affect. behavior is normal. Judgment and thought content normal.  PHQ2/9: Depression screen [facility] 2/9 11/19/2015 07/18/2015  Decreased Interest 0 0  Down, Depressed, Hopeless 0 0  PHQ - 2 Score 0 0     Fall Risk: Fall Risk  11/19/2015  Falls in the past year? No    Functional Status Survey: Is the patient deaf or have difficulty hearing?: No Does the patient have difficulty seeing, even when wearing glasses/contacts?: No Does the patient have difficulty concentrating, remembering, or making decisions?: No Does the patient have difficulty walking or climbing stairs?: No Does the patient have difficulty dressing or bathing?: No Does the patient have difficulty doing errands alone such as visiting a doctor's office or shopping?: No    Assessment & Plan  1. Asthma, moderate persistent, poorly-controlled  Continue home medications, and add Prednisone - predniSONE (DELTASONE) 10 MG tablet; Take 3 tablets (30 mg total) by mouth daily with breakfast.  Dispense: 15 tablet; Refill: 0  2. Diarrhea, unspecified type  Per patient it happens every time she has an asthma flare and has to use rescue inhaler more often, advised Imodium  and we will start prednisone so she can decrease Ventolin use.

## 2015-12-04 ENCOUNTER — Ambulatory Visit: Payer: 59 | Admitting: Family Medicine

## 2016-02-27 ENCOUNTER — Other Ambulatory Visit: Payer: Self-pay | Admitting: Family Medicine

## 2016-02-28 MED ORDER — FLUOXETINE HCL 40 MG PO CAPS: 40.0000 mg | ORAL_CAPSULE | Freq: Every day | ORAL | Status: DC

## 2016-03-10 ENCOUNTER — Encounter: Payer: Self-pay | Admitting: Family Medicine

## 2016-03-10 ENCOUNTER — Ambulatory Visit (INDEPENDENT_AMBULATORY_CARE_PROVIDER_SITE_OTHER): Payer: 59 | Admitting: Family Medicine

## 2016-03-10 VITALS — BP 134/80 | HR 72 | Temp 98.8°F | Resp 16 | Wt 202.0 lb

## 2016-03-10 DIAGNOSIS — Z79899 Other long term (current) drug therapy: Secondary | ICD-10-CM

## 2016-03-10 DIAGNOSIS — R7301 Impaired fasting glucose: Secondary | ICD-10-CM

## 2016-03-10 DIAGNOSIS — E785 Hyperlipidemia, unspecified: Secondary | ICD-10-CM

## 2016-03-10 DIAGNOSIS — J4520 Mild intermittent asthma, uncomplicated: Secondary | ICD-10-CM

## 2016-03-10 DIAGNOSIS — Z5181 Encounter for therapeutic drug level monitoring: Secondary | ICD-10-CM

## 2016-03-10 DIAGNOSIS — T162XXA Foreign body in left ear, initial encounter: Secondary | ICD-10-CM

## 2016-03-10 DIAGNOSIS — F5081 Binge eating disorder: Secondary | ICD-10-CM | POA: Diagnosis not present

## 2016-03-10 DIAGNOSIS — F50819 Binge eating disorder, unspecified: Secondary | ICD-10-CM

## 2016-03-10 MED ORDER — LISDEXAMFETAMINE DIMESYLATE 30 MG PO CAPS: 30.0000 mg | ORAL_CAPSULE | Freq: Every day | ORAL | 0 refills | Status: DC

## 2016-03-10 NOTE — Progress Notes (Signed)
BP 134/80   Pulse 72   Temp 98.8 F (37.1 C) (Oral)   Resp 16   Wt 202 lb (91.6 kg)   SpO2 96%   BMI 33.61 kg/m    Subjective:    Patient ID: Julie Ayala, female    DOB: 18-Apr-1956, 60 y.o.   MRN: [id number]  HPI: Julie Ayala is a 60 y.o. female  Chief Complaint  Patient presents with  . Dizziness   She had some issues with asthma and temperature changes at work; triggers include big changes in air in and outdoors; trip to urgent care; had a visit here to Dr. Ancil Boozer in early April for asthma; much improved now  She is having dizziness; went to urgent care; three people she was around tested positive for strep; she woke up dizzy and had nausea; they treated her for strep, that was July 5th, [facility] urgent care She tends to get dizzy, just slightly on some days, the last week she scratched her ear and sounded like someone crinkling something inside her ear; when they checked her for strep; she has not been dizzy for 3 days but still hears crinkling She was in water and her sister has a pool, does water aerobics; no drainage; no fevers; never had sore throat; just dizzy  She has prediabetes; last A1c was 5.7 just last week; no symptoms of high sugars  She has high cholesterol; had health assessment; total 233, HDL 44; ten year ASCVD risk <5%  She has binge-eating disorder; using hypnosis CD of sessions; between 20-30 days per month; moved time of bingeing to lunch; decreased SSRI to 40 mg instead of 50 mg but timing occurred before the drop; she would love to try something like Vyvanse when discussed  Depression screen [facility] 2/9 03/10/2016 11/19/2015 07/18/2015  Decreased Interest 0 0 0  Down, Depressed, Hopeless 0 0 0  PHQ - 2 Score 0 0 0   Relevant past medical, surgical, family and social history reviewed Past Medical History:  Diagnosis Date  . Allergy   . Asthma   . Asthma, mild intermittent, well-controlled 07/19/2015  . Binge eating disorder   . Clark level III  melanoma ([place])   . History of chest pain    nuclear med stress test Sep 25, 2014 negative, EF 69%  . Hyperlipidemia   . IFG (impaired fasting glucose)   . Insomnia   . OCD (obsessive compulsive disorder)   . Post-menopausal   . Sciatica    Past Surgical History:  Procedure Laterality Date  . CHOLECYSTECTOMY  2013  . COLONOSCOPY  06/2012   small polyps, repeat in 06/2016  . CYSTECTOMY     uterine cyst removed  . CYSTECTOMY     Hyoid cyst removed  . MELANOMA EXCISION     Family History  Problem Relation Age of Onset  . Hypertension Mother   . Heart disease Mother     pacemaker  . Congestive Heart Failure Mother     with pacemaker  . Stroke Mother     TIA/CVA  . Cancer Father     colon and lung  . Hypertension Sister   . Mental illness Sister     anxiety  . Hypothyroidism Sister   . Heart disease Brother     unknown heart condition name, EF 55%  . Hypertension Brother   . Cancer Son     melanoma  . Diabetes Paternal Aunt   . Diabetes Paternal Uncle   . Hypertension Brother   .  Heart Problems Son     prolonged QT syndrome (syncope)  . Stroke Maternal Grandmother   . COPD Neg Hx    Social History  Substance Use Topics  . Smoking status: Former Smoker    Years: 0.50    Types: Cigarettes    Quit date: 03/14/1987  . Smokeless tobacco: Never Used  . Alcohol use No   Interim medical history since last visit reviewed. Allergies and medications reviewed  Review of Systems Per HPI unless specifically indicated above     Objective:    BP 134/80   Pulse 72   Temp 98.8 F (37.1 C) (Oral)   Resp 16   Wt 202 lb (91.6 kg)   SpO2 96%   BMI 33.61 kg/m   Wt Readings from Last 3 Encounters:  03/10/16 202 lb (91.6 kg)  11/19/15 200 lb 12.8 oz (91.1 kg)  07/18/15 189 lb (85.7 kg)    Physical Exam  Constitutional: She appears well-developed and well-nourished. No distress.  Obese  HENT:  Right Ear: Hearing, tympanic membrane, external ear and ear canal  normal.  Left Ear: Hearing, tympanic membrane, external ear and ear canal normal.  Mouth/Throat: Oropharynx is clear and moist and mucous membranes are normal.  Single long hair in the left ear canal extending to the drum; removed under direct visualization with improvement in sx  Eyes: EOM are normal. Right eye exhibits no discharge. Left eye exhibits no discharge. Right conjunctiva is not injected. Left conjunctiva is not injected. No scleral icterus.  Neck: No JVD present. No thyromegaly present.  Cardiovascular: Regular rhythm.   No extrasystoles are present. Bradycardia present.   Pulmonary/Chest: Effort normal and breath sounds normal. No accessory muscle usage. No respiratory distress. She has no decreased breath sounds. She has no wheezes. She has no rhonchi. She has no rales.  Abdominal: She exhibits no distension.  Musculoskeletal: She exhibits no edema.  Lymphadenopathy:    She has no cervical adenopathy.  Skin: No pallor.  Psychiatric: She has a normal mood and affect. Her behavior is normal. Judgment normal. Her mood appears not anxious. She does not exhibit a depressed mood.   Results for orders placed or performed in visit on 03/13/15  HM COLONOSCOPY  Result Value Ref Range   HM Colonoscopy small polyps, repeat in Nov. 2017       Assessment & Plan:   Problem List Items Addressed This Visit      Respiratory   Asthma, mild intermittent, well-controlled    Much improved; continue current meds        Endocrine   IFG (impaired fasting glucose)    She just had an A1c of 5.7 last week through work labs; weight loss and healthy eating are keys, though she struggles with both because of her binge eating disorder; we'll monitor A1c every 6 months        Nervous and Auditory   Foreign body in left ear    I think this is what was causing her symptoms; removed; should resolve; let me know if not        Other   Hyperlipidemia    She does not want statins, and we did the  10 year ASCVD risk assessment and she doesn't actually qualify to be in the statin benefit group at this time anyway; weight loss and health eating key, though BED is a struggle for her      Controlled substance agreement signed    Typical speech given for controlled  substance; do not take any other stimulants; UDS collected for routine protocol      Binge eating disorder - Primary    We talked about where she is right now, what she's already doing (hynotic tapes, SSRI); discussed risk of Vyvanse, heart attack, stroke, elevated BP, etc and she is willing to try the Vyvanse; Rx supplied and we'll see her next month for adjustment/assessment; call with any issues before then       Other Visit Diagnoses    Encounter for medication monitoring       Relevant Orders   Drug Screen 12+Alcohol+CRT, Ur      Follow up plan: No Follow-up on file.  An after-visit summary was printed and given to the patient at [place].  Please see the patient instructions which may contain other information and recommendations beyond what is mentioned above in the assessment and plan.  Meds ordered this encounter  Medications  . lisdexamfetamine (VYVANSE) 30 MG capsule    Sig: Take 1 capsule (30 mg total) by mouth daily.    Dispense:  30 capsule    Refill:  0    Orders Placed This Encounter  Procedures  . Drug Screen 12+Alcohol+CRT, Ur

## 2016-03-10 NOTE — Assessment & Plan Note (Addendum)
She does not want statins, and we did the 10 year ASCVD risk assessment and she doesn't actually qualify to be in the statin benefit group at this time anyway; weight loss and health eating key, though BED is a struggle for her

## 2016-03-10 NOTE — Patient Instructions (Addendum)
Let's start the new medicine Keep up your other activities Monitor your pulse and blood pressure three times a week for the first two weeks and call me if pulse over 100 or BP over 140/90 If any chest pain, call 911 Return in August for your next appointment

## 2016-03-14 ENCOUNTER — Encounter: Payer: Self-pay | Admitting: Family Medicine

## 2016-03-14 DIAGNOSIS — T162XXA Foreign body in left ear, initial encounter: Secondary | ICD-10-CM | POA: Insufficient documentation

## 2016-03-14 DIAGNOSIS — Z79899 Other long term (current) drug therapy: Secondary | ICD-10-CM | POA: Insufficient documentation

## 2016-03-14 NOTE — Assessment & Plan Note (Signed)
She just had an A1c of 5.7 last week through work labs; weight loss and healthy eating are keys, though she struggles with both because of her binge eating disorder; we'll monitor A1c every 6 months

## 2016-03-14 NOTE — Assessment & Plan Note (Signed)
I think this is what was causing her symptoms; removed; should resolve; let me know if not

## 2016-03-14 NOTE — Assessment & Plan Note (Signed)
We talked about where she is right now, what she's already doing (hynotic tapes, SSRI); discussed risk of Vyvanse, heart attack, stroke, elevated BP, etc and she is willing to try the Vyvanse; Rx supplied and we'll see her next month for adjustment/assessment; call with any issues before then

## 2016-03-14 NOTE — Assessment & Plan Note (Signed)
Typical speech given for controlled substance; do not take any other stimulants; UDS collected for routine protocol

## 2016-03-14 NOTE — Assessment & Plan Note (Signed)
Much improved; continue current meds

## 2016-03-22 ENCOUNTER — Encounter: Payer: Self-pay | Admitting: Family Medicine

## 2016-04-07 ENCOUNTER — Encounter: Payer: 59 | Admitting: Family Medicine

## 2016-04-26 ENCOUNTER — Ambulatory Visit (INDEPENDENT_AMBULATORY_CARE_PROVIDER_SITE_OTHER): Payer: 59 | Admitting: Family Medicine

## 2016-04-26 ENCOUNTER — Encounter: Payer: Self-pay | Admitting: Family Medicine

## 2016-04-26 DIAGNOSIS — F5081 Binge eating disorder: Secondary | ICD-10-CM

## 2016-04-26 DIAGNOSIS — J4520 Mild intermittent asthma, uncomplicated: Secondary | ICD-10-CM

## 2016-04-26 MED ORDER — FLUOXETINE HCL 40 MG PO CAPS: 40.0000 mg | ORAL_CAPSULE | Freq: Every day | ORAL | 1 refills | Status: DC

## 2016-04-26 MED ORDER — IBUPROFEN 600 MG PO TABS: 600.0000 mg | ORAL_TABLET | Freq: Three times a day (TID) | ORAL | 1 refills | Status: DC | PRN

## 2016-04-26 MED ORDER — ALBUTEROL SULFATE HFA 108 (90 BASE) MCG/ACT IN AERS: 2.0000 | INHALATION_SPRAY | RESPIRATORY_TRACT | 1 refills | Status: DC | PRN

## 2016-04-26 MED ORDER — MONTELUKAST SODIUM 10 MG PO TABS
10.0000 mg | ORAL_TABLET | Freq: Every day | ORAL | 3 refills | Status: DC
Start: 2016-04-26 — End: 2016-12-08

## 2016-04-26 MED ORDER — BUDESONIDE-FORMOTEROL FUMARATE 160-4.5 MCG/ACT IN AERO: 2.0000 | INHALATION_SPRAY | Freq: Two times a day (BID) | RESPIRATORY_TRACT | 3 refills | Status: DC

## 2016-04-26 NOTE — Patient Instructions (Signed)
Start back on singulair regularly Use the symbicort regularly Avoid triggers Return soon for a physical when due

## 2016-04-26 NOTE — Progress Notes (Signed)
BP 128/74   Pulse 70   Temp 98.8 F (37.1 C) (Oral)   SpO2 95%    Subjective:    Patient ID: Julie Ayala, female    DOB: [birth year], 60 y.o.   MRN: [id number]  HPI: Julie Ayala is a 60 y.o. female  Chief Complaint  Patient presents with  . Asthma    She had a flare of asthma last week; slept with windows open, temperatures got down to 52 She had not been using rescue inhaler before last week, and then started to have to use 4-5 times a day symbicort use was spotty, not BID every day She had also been off of singulair No fevers; no sore throat; no problems with ears or sinuses No new pets No change in home environment Heat did not kick on She has intermittent flares and needs FMLA form for episodes  Never got the Vyvanse, cost of medicine prohibitive; it was prescribed for her binge eating disorder She did not weigh today  Depression screen [facility] 2/9 03/10/2016 11/19/2015 07/18/2015  Decreased Interest 0 0 0  Down, Depressed, Hopeless 0 0 0  PHQ - 2 Score 0 0 0   Relevant past medical, surgical, family and social history reviewed Past Medical History:  Diagnosis Date  . Allergy   . Asthma   . Asthma, mild intermittent, well-controlled 07/19/2015  . Binge eating disorder   . 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[place])   . History of chest pain    nuclear med stress test Sep 25, 2014 negative, EF 69%  . Hyperlipidemia   . IFG (impaired fasting glucose)   . Insomnia   . OCD (obsessive compulsive disorder)   . Post-menopausal   . Sciatica    Past Surgical History:  Procedure Laterality Date  . CHOLECYSTECTOMY  2013  . COLONOSCOPY  06/2012   small polyps, repeat in 06/2016  . CYSTECTOMY     uterine cyst removed  . CYSTECTOMY     Hyoid cyst removed  . MELANOMA EXCISION     Social History  Substance Use Topics  . Smoking status: Former Smoker    Years: 0.50    Types: Cigarettes    Quit date: 03/14/1987  . Smokeless tobacco: Never Used  . Alcohol use No   Interim  medical history since last visit reviewed. Allergies and medications reviewed  Review of Systems Per HPI unless specifically indicated above     Objective:    BP 128/74   Pulse 70   Temp 98.8 F (37.1 C) (Oral)   SpO2 95%   Wt Readings from Last 3 Encounters:  03/10/16 202 lb (91.6 kg)  11/19/15 200 lb 12.8 oz (91.1 kg)  07/18/15 189 lb (85.7 kg)    Physical Exam  Constitutional: She appears well-developed and well-nourished. No distress.  Cardiovascular: Normal rate, regular rhythm and normal heart sounds.   Pulmonary/Chest: Effort normal and breath sounds normal. No respiratory distress. She has no wheezes.  Musculoskeletal: Normal range of motion. She exhibits no edema.  Neurological: She is alert. She exhibits normal muscle tone.  Skin: Skin is warm and dry. She is not diaphoretic. No pallor.  Psychiatric: She has a normal mood and affect. Her behavior is normal. Judgment and thought content normal. Her mood appears not anxious. She does not exhibit a depressed mood.   Results for orders placed or performed in visit on 03/13/15  HM COLONOSCOPY  Result Value Ref Range   HM Colonoscopy small polyps, repeat in Nov.  2017       Assessment & Plan:   Problem List Items Addressed This Visit      Respiratory   Asthma, mild intermittent, well-controlled    Start back on singulair, get back on regular use of symbicort; avoid triggers; no prednisone right now; use SABA as needed; she will get flu shot later in the season; she needs FMLA forms for asthma for intermittent flares      Relevant Medications   albuterol (PROVENTIL HFA;VENTOLIN HFA) 108 (90 Base) MCG/ACT inhaler   budesonide-formoterol (SYMBICORT) 160-4.5 MCG/ACT inhaler   montelukast (SINGULAIR) 10 MG tablet     Other   Binge eating disorder    Unfortunately, she did not get good coverage for the Vyvanse; continue SSRI       Other Visit Diagnoses   None.     Follow up plan: Return if symptoms worsen or  fail to improve, for complete physical (when due).  An after-visit summary was printed and given to the patient at [place].  Please see the patient instructions which may contain other information and recommendations beyond what is mentioned above in the assessment and plan.  Meds ordered this encounter  Medications  . FLUoxetine (PROZAC) 40 MG capsule    Sig: Take 1 capsule (40 mg total) by mouth daily.    Dispense:  90 capsule    Refill:  1  . albuterol (PROVENTIL HFA;VENTOLIN HFA) 108 (90 Base) MCG/ACT inhaler    Sig: Inhale 2 puffs into the lungs every 4 (four) hours as needed for wheezing or shortness of breath.    Dispense:  1 Inhaler    Refill:  1  . budesonide-formoterol (SYMBICORT) 160-4.5 MCG/ACT inhaler    Sig: Inhale 2 puffs into the lungs 2 (two) times daily.    Dispense:  3 Inhaler    Refill:  3  . montelukast (SINGULAIR) 10 MG tablet    Sig: Take 1 tablet (10 mg total) by mouth at bedtime.    Dispense:  90 tablet    Refill:  3  . ibuprofen (ADVIL,MOTRIN) 600 MG tablet    Sig: Take 1 tablet (600 mg total) by mouth every 8 (eight) hours as needed.    Dispense:  90 tablet    Refill:  1

## 2016-04-26 NOTE — Assessment & Plan Note (Signed)
Start back on singulair, get back on regular use of symbicort; avoid triggers; no prednisone right now; use SABA as needed; she will get flu shot later in the season; she needs FMLA forms for asthma for intermittent flares

## 2016-04-26 NOTE — Assessment & Plan Note (Signed)
Unfortunately, she did not get good coverage for the Vyvanse; continue SSRI

## 2016-05-24 ENCOUNTER — Other Ambulatory Visit: Payer: Self-pay | Admitting: Family Medicine

## 2016-05-24 DIAGNOSIS — E782 Mixed hyperlipidemia: Secondary | ICD-10-CM

## 2016-05-24 DIAGNOSIS — R7301 Impaired fasting glucose: Secondary | ICD-10-CM

## 2016-05-24 DIAGNOSIS — Z5181 Encounter for therapeutic drug level monitoring: Secondary | ICD-10-CM | POA: Insufficient documentation

## 2016-05-24 NOTE — Telephone Encounter (Signed)
Please ask pt to have some labs done to monitor her ibuprofen I don't have a platelet count or kidney function on the chart since before January 15, 2015 I'll approve Rx, but ask if she can have labs done in the next week or two I'll order cholesterol and sugar, so please go fasting Thank you

## 2016-05-24 NOTE — Assessment & Plan Note (Signed)
Check A1c, glucose 

## 2016-05-24 NOTE — Assessment & Plan Note (Signed)
Check lipids 

## 2016-05-24 NOTE — Assessment & Plan Note (Signed)
Check platelets, creatinine

## 2016-05-24 NOTE — Telephone Encounter (Signed)
Left voicemail to get labs drawn

## 2016-05-31 ENCOUNTER — Ambulatory Visit (INDEPENDENT_AMBULATORY_CARE_PROVIDER_SITE_OTHER): Payer: 59 | Admitting: Family Medicine

## 2016-05-31 ENCOUNTER — Encounter: Payer: Self-pay | Admitting: Family Medicine

## 2016-05-31 VITALS — BP 128/70 | HR 67 | Temp 98.2°F | Resp 16 | Ht 64.0 in | Wt 197.6 lb

## 2016-05-31 DIAGNOSIS — Z1231 Encounter for screening mammogram for malignant neoplasm of breast: Secondary | ICD-10-CM | POA: Insufficient documentation

## 2016-05-31 DIAGNOSIS — E782 Mixed hyperlipidemia: Secondary | ICD-10-CM | POA: Diagnosis not present

## 2016-05-31 DIAGNOSIS — E669 Obesity, unspecified: Secondary | ICD-10-CM | POA: Insufficient documentation

## 2016-05-31 DIAGNOSIS — Z0000 Encounter for general adult medical examination without abnormal findings: Secondary | ICD-10-CM | POA: Diagnosis not present

## 2016-05-31 DIAGNOSIS — Z23 Encounter for immunization: Secondary | ICD-10-CM | POA: Diagnosis not present

## 2016-05-31 DIAGNOSIS — R7301 Impaired fasting glucose: Secondary | ICD-10-CM

## 2016-05-31 DIAGNOSIS — Z5181 Encounter for therapeutic drug level monitoring: Secondary | ICD-10-CM | POA: Diagnosis not present

## 2016-05-31 DIAGNOSIS — E6609 Other obesity due to excess calories: Secondary | ICD-10-CM

## 2016-05-31 DIAGNOSIS — Z6833 Body mass index (BMI) 33.0-33.9, adult: Secondary | ICD-10-CM | POA: Diagnosis not present

## 2016-05-31 MED ORDER — NALTREXONE-BUPROPION HCL ER 8-90 MG PO TB12: ORAL_TABLET | ORAL | 0 refills | Status: DC

## 2016-05-31 NOTE — Assessment & Plan Note (Signed)
Check a1c 

## 2016-05-31 NOTE — Patient Instructions (Addendum)
You will be due for your next colonoscopy in November 2018 Please do call to schedule your mammogram; the number to schedule one at either [facility] or [facility] is [phone] Have fasting labs at your earliest convenience  Return for med check in 4-5 weeks Health Maintenance, Female Adopting a healthy lifestyle and getting preventive care can go a long way to promote health and wellness. Talk with your health care provider about what schedule of regular examinations is right for you. This is a good chance for you to check in with your provider about disease prevention and staying healthy. In between checkups, there are plenty of things you can do on your own. Experts have done a lot of research about which lifestyle changes and preventive measures are most likely to keep you healthy. Ask your health care provider for more information. WEIGHT AND DIET  Eat a healthy diet  Be sure to include plenty of vegetables, fruits, low-fat dairy products, and lean protein.  Do not eat a lot of foods high in solid fats, added sugars, or salt.  Get regular exercise. This is one of the most important things you can do for your health.  Most adults should exercise for at least 150 minutes each week. The exercise should increase your heart rate and make you sweat (moderate-intensity exercise).  Most adults should also do strengthening exercises at least twice a week. This is in addition to the moderate-intensity exercise.  Maintain a healthy weight  Body mass index (BMI) is a measurement that can be used to identify possible weight problems. It estimates body fat based on height and weight. Your health care provider can help determine your BMI and help you achieve or maintain a healthy weight.  For females 49 years of age and older:   A BMI below 18.5 is considered underweight.  A BMI of 18.5 to 24.9 is normal.  A BMI of 25 to 29.9 is considered overweight.  A BMI  of 30 and above is considered obese.  Watch levels of cholesterol and blood lipids  You should start having your blood tested for lipids and cholesterol at 59 years of age, then have this test every 5 years.  You may need to have your cholesterol levels checked more often if:  Your lipid or cholesterol levels are high.  You are older than 58 years of age.  You are at high risk for heart disease.  CANCER SCREENING   Lung Cancer  Lung cancer screening is recommended for adults 6-32 years old who are at high risk for lung cancer because of a history of smoking.  A yearly low-dose CT scan of the lungs is recommended for people who:  Currently smoke.  Have quit within the past 15 years.  Have at least a 30-pack-year history of smoking. A pack year is smoking an average of one pack of cigarettes a day for 1 year.  Yearly screening should continue until it has been 15 years since you quit.  Yearly screening should stop if you develop a health problem that would prevent you from having lung cancer treatment.  Breast Cancer  Practice breast self-awareness. This means understanding how your breasts normally appear and feel.  It also means doing regular breast self-exams. Let your health care provider know about any changes, no matter how small.  If you are in your 20s or 30s, you should have a clinical breast exam (CBE) by a health care provider every 1-3 years  as part of a regular health exam.  If you are 27 or older, have a CBE every year. Also consider having a breast X-ray (mammogram) every year.  If you have a family history of breast cancer, talk to your health care provider about genetic screening.  If you are at high risk for breast cancer, talk to your health care provider about having an MRI and a mammogram every year.  Breast cancer gene (BRCA) assessment is recommended for women who have family members with BRCA-related cancers. BRCA-related cancers  include:  Breast.  Ovarian.  Tubal.  Peritoneal cancers.  Results of the assessment will determine the need for genetic counseling and BRCA1 and BRCA2 testing. Cervical Cancer Your health care provider may recommend that you be screened regularly for cancer of the pelvic organs (ovaries, uterus, and vagina). This screening involves a pelvic examination, including checking for microscopic changes to the surface of your cervix (Pap test). You may be encouraged to have this screening done every 3 years, beginning at age 77.  For women ages 17-65, health care providers may recommend pelvic exams and Pap testing every 3 years, or they may recommend the Pap and pelvic exam, combined with testing for human papilloma virus (HPV), every 5 years. Some types of HPV increase your risk of cervical cancer. Testing for HPV may also be done on women of any age with unclear Pap test results.  Other health care providers may not recommend any screening for nonpregnant women who are considered low risk for pelvic cancer and who do not have symptoms. Ask your health care provider if a screening pelvic exam is right for you.  If you have had past treatment for cervical cancer or a condition that could lead to cancer, you need Pap tests and screening for cancer for at least 20 years after your treatment. If Pap tests have been discontinued, your risk factors (such as having a new sexual partner) need to be reassessed to determine if screening should resume. Some women have medical problems that increase the chance of getting cervical cancer. In these cases, your health care provider may recommend more frequent screening and Pap tests. Colorectal Cancer  This type of cancer can be detected and often prevented.  Routine colorectal cancer screening usually begins at 58 years of age and continues through 58 years of age.  Your health care provider may recommend screening at an earlier age if you have risk factors for  colon cancer.  Your health care provider may also recommend using home test kits to check for hidden blood in the stool.  A small camera at the end of a tube can be used to examine your colon directly (sigmoidoscopy or colonoscopy). This is done to check for the earliest forms of colorectal cancer.  Routine screening usually begins at age 7.  Direct examination of the colon should be repeated every 5-10 years through 58 years of age. However, you may need to be screened more often if early forms of precancerous polyps or small growths are found. Skin Cancer  Check your skin from head to toe regularly.  Tell your health care provider about any new moles or changes in moles, especially if there is a change in a mole's shape or color.  Also tell your health care provider if you have a mole that is larger than the size of a pencil eraser.  Always use sunscreen. Apply sunscreen liberally and repeatedly throughout the day.  Protect yourself by wearing long sleeves,  pants, a wide-brimmed hat, and sunglasses whenever you are outside. HEART DISEASE, DIABETES, AND HIGH BLOOD PRESSURE   High blood pressure causes heart disease and increases the risk of stroke. High blood pressure is more likely to develop in:  People who have blood pressure in the high end of the normal range (130-139/85-89 mm Hg).  People who are overweight or obese.  People who are African American.  If you are 24-66 years of age, have your blood pressure checked every 3-5 years. If you are 81 years of age or older, have your blood pressure checked every year. You should have your blood pressure measured twice--once when you are at a hospital or clinic, and once when you are not at a hospital or clinic. Record the average of the two measurements. To check your blood pressure when you are not at a hospital or clinic, you can use:  An automated blood pressure machine at a pharmacy.  A home blood pressure monitor.  If you  are between 16 years and 28 years old, ask your health care provider if you should take aspirin to prevent strokes.  Have regular diabetes screenings. This involves taking a blood sample to check your fasting blood sugar level.  If you are at a normal weight and have a low risk for diabetes, have this test once every three years after 58 years of age.  If you are overweight and have a high risk for diabetes, consider being tested at a younger age or more often. PREVENTING INFECTION  Hepatitis B  If you have a higher risk for hepatitis B, you should be screened for this virus. You are considered at high risk for hepatitis B if:  You were born in a country where hepatitis B is common. Ask your health care provider which countries are considered high risk.  Your parents were born in a high-risk country, and you have not been immunized against hepatitis B (hepatitis B vaccine).  You have HIV or AIDS.  You use needles to inject street drugs.  You live with someone who has hepatitis B.  You have had sex with someone who has hepatitis B.  You get hemodialysis treatment.  You take certain medicines for conditions, including cancer, organ transplantation, and autoimmune conditions. Hepatitis C  Blood testing is recommended for:  Everyone born from [birth year] through 1963.  Anyone with known risk factors for hepatitis C. Sexually transmitted infections (STIs)  You should be screened for sexually transmitted infections (STIs) including gonorrhea and chlamydia if:  You are sexually active and are younger than 58 years of age.  You are older than 58 years of age and your health care provider tells you that you are at risk for this type of infection.  Your sexual activity has changed since you were last screened and you are at an increased risk for chlamydia or gonorrhea. Ask your health care provider if you are at risk.  If you do not have HIV, but are at risk, it may be recommended that you  take a prescription medicine daily to prevent HIV infection. This is called pre-exposure prophylaxis (PrEP). You are considered at risk if:  You are sexually active and do not regularly use condoms or know the HIV status of your partner(s).  You take drugs by injection.  You are sexually active with a partner who has HIV. Talk with your health care provider about whether you are at high risk of being infected with HIV. If you choose to  begin PrEP, you should first be tested for HIV. You should then be tested every 3 months for as long as you are taking PrEP.  PREGNANCY   If you are premenopausal and you may become pregnant, ask your health care provider about preconception counseling.  If you may become pregnant, take 400 to 800 micrograms (mcg) of folic acid every day.  If you want to prevent pregnancy, talk to your health care provider about birth control (contraception). OSTEOPOROSIS AND MENOPAUSE   Osteoporosis is a disease in which the bones lose minerals and strength with aging. This can result in serious bone fractures. Your risk for osteoporosis can be identified using a bone density scan.  If you are 7 years of age or older, or if you are at risk for osteoporosis and fractures, ask your health care provider if you should be screened.  Ask your health care provider whether you should take a calcium or vitamin D supplement to lower your risk for osteoporosis.  Menopause may have certain physical symptoms and risks.  Hormone replacement therapy may reduce some of these symptoms and risks. Talk to your health care provider about whether hormone replacement therapy is right for you.  HOME CARE INSTRUCTIONS   Schedule regular health, dental, and eye exams.  Stay current with your immunizations.   Do not use any tobacco products including cigarettes, chewing tobacco, or electronic cigarettes.  If you are pregnant, do not drink alcohol.  If you are breastfeeding, limit how  much and how often you drink alcohol.  Limit alcohol intake to no more than 1 drink per day for nonpregnant women. One drink equals 12 ounces of beer, 5 ounces of wine, or 1 ounces of hard liquor.  Do not use street drugs.  Do not share needles.  Ask your health care provider for help if you need support or information about quitting drugs.  Tell your health care provider if you often feel depressed.  Tell your health care provider if you have ever been abused or do not feel safe at home.   This information is not intended to replace advice given to you by your health care provider. Make sure you discuss any questions you have with your health care provider.   Document Released: 02/15/2011 Document Revised: 08/23/2014 Document Reviewed: 07/04/2013 Elsevier Interactive Patient Education [employer].

## 2016-05-31 NOTE — Progress Notes (Signed)
Patient ID: Julie Ayala, female   DOB: 03/07/1956, 60 y.o.   MRN: [id number]   Subjective:   Julie Ayala is a 60 y.o. female here for a complete physical exam  Interim issues since last visit: she broke her left scaphoid since last visit, dance skating; she talked them into not casting it; she goes back in a few weeks; sensation is normal  USPSTF grade A and B recommendations Alcohol: no Depression:  Depression screen [facility] 2/9 05/31/2016 03/10/2016 11/19/2015 07/18/2015  Decreased Interest 0 0 0 0  Down, Depressed, Hopeless 0 0 0 0  PHQ - 2 Score 0 0 0 0  Hypertension: controlled Obesity: working on this; down 5 pounds since last visit Tobacco use: quit almost 30 years ago Lipids: order given Glucose: order given Colorectal cancer: done in 2013; next due 2018 Breast cancer: mammo BRCA gene screening: no Intimate partner violence: no abuse Cervical cancer screening: UTD Lung cancer: n/a Osteoporosis: n/a Fall prevention/vitamin D: start 1,000 iu vid D3 daily Aspirin: risk was <5% Diet: trying  Exercise: active Skin cancer: follows with derm ------------------------ She would like to start Contrave; she has read about it; not taking any narcotic pain medicine  Past Medical History:  Diagnosis Date  . Allergy   . Asthma   . Asthma, mild intermittent, well-controlled 07/19/2015  . Binge eating disorder   . 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 History of chest pain    nuclear med stress test Sep 25, 2014 negative, EF 69%  . Hyperlipidemia   . IFG (impaired fasting glucose)   . Insomnia   . OCD (obsessive compulsive disorder)   . Post-menopausal   . Sciatica    Past Surgical History:  Procedure Laterality Date  . CHOLECYSTECTOMY  2013  . COLONOSCOPY  06/2012   small polyps, repeat in 06/2016  . CYSTECTOMY     uterine cyst removed  . CYSTECTOMY     Hyoid cyst removed  . MELANOMA EXCISION     Family History  Problem Relation Age of Onset  . Hypertension Mother   .  Heart disease Mother     pacemaker  . Congestive Heart Failure Mother     with pacemaker  . Stroke Mother     TIA/CVA  . Cancer Father     colon and lung  . Hypertension Sister   . Mental illness Sister     anxiety  . Hypothyroidism Sister   . Heart disease Brother     unknown heart condition name, EF 55%  . Hypertension Brother   . Cancer Son     melanoma  . Diabetes Paternal Aunt   . Diabetes Paternal Uncle   . Hypertension Brother   . Heart Problems Son     prolonged QT syndrome (syncope)  . Stroke Maternal Grandmother   . COPD Neg Hx   MD notes Mother lung cancer, stage 1 at age 31 Father started in colon, spread to lung  Social History  Substance Use Topics  . Smoking status: Former Smoker    Years: 0.50    Types: Cigarettes    Quit date: 03/14/1987  . Smokeless tobacco: Never Used  . Alcohol use No   Review of Systems  Constitutional: Negative for unexpected weight change.  HENT: Negative for hearing loss.   Eyes: Negative for visual disturbance.  Psychiatric/Behavioral: Negative for dysphoric mood.    Objective:   Vitals:   05/31/16 1359  BP: 128/70  Pulse: 67  Resp: 16  Temp: 98.2 F (36.8 C)  TempSrc: Oral  SpO2: 97%  Weight: 197 lb 9 oz (89.6 kg)  Height: 5' 4" (1.626 m)   Body mass index is 33.91 kg/m. Wt Readings from Last 3 Encounters:  05/31/16 197 lb 9 oz (89.6 kg)  03/10/16 202 lb (91.6 kg)  11/19/15 200 lb 12.8 oz (91.1 kg)   Physical Exam  Constitutional: She appears well-developed and well-nourished.  HENT:  Head: Normocephalic and atraumatic.  Right Ear: Hearing, tympanic membrane, external ear and ear canal normal.  Left Ear: Hearing, tympanic membrane, external ear and ear canal normal.  Eyes: Conjunctivae and EOM are normal. Right eye exhibits no hordeolum. Left eye exhibits no hordeolum. No scleral icterus.  Neck: Carotid bruit is not present. No thyromegaly present.  Cardiovascular: Normal rate, regular rhythm, S1  normal, S2 normal and normal heart sounds.   No extrasystoles are present.  Pulmonary/Chest: Effort normal and breath sounds normal. No respiratory distress. Right breast exhibits no inverted nipple, no mass, no nipple discharge, no skin change and no tenderness. Left breast exhibits no inverted nipple, no mass, no nipple discharge, no skin change and no tenderness. Breasts are symmetrical.  Abdominal: Soft. Normal appearance and bowel sounds are normal. She exhibits no distension, no abdominal bruit, no pulsatile midline mass and no mass. There is no hepatosplenomegaly. There is no tenderness. No hernia.  Musculoskeletal: She exhibits no edema.       Left wrist: She exhibits decreased range of motion (wrapped (not casted)).  Lymphadenopathy:       Head (right side): No submandibular adenopathy present.       Head (left side): No submandibular adenopathy present.    She has no cervical adenopathy.    She has no axillary adenopathy.  Neurological: She is alert. She displays no tremor. No cranial nerve deficit. She exhibits normal muscle tone. Gait normal.  Reflex Scores:      Patellar reflexes are 2+ on the right side and 2+ on the left side. Skin: Skin is warm and dry. No bruising and no ecchymosis noted. No cyanosis. No pallor.  Psychiatric: Her speech is normal and behavior is normal. Thought content normal. Her mood appears not anxious. She does not exhibit a depressed mood.    Assessment/Plan:   Problem List Items Addressed This Visit      Endocrine   IFG (impaired fasting glucose)    Check a1c        Other   Visit for screening mammogram    Order for mammo      Relevant Orders   MM DIGITAL SCREENING BILATERAL   Preventative health care - Primary    USPSTF grade A and B recommendations reviewed with patient; age-appropriate recommendations, preventive care, screening tests, etc discussed and encouraged; healthy living encouraged; see AVS for patient education given to patient       Relevant Orders   CBC with Differential/Platelet   Comprehensive metabolic panel   Hemoglobin A1c   Lipid panel   TSH   Obesity    Will start contrave and see her back in 4-5 weeks      Relevant Medications   Naltrexone-Bupropion HCl ER 8-90 MG TB12   Hyperlipidemia   Encounter for medication monitoring    Other Visit Diagnoses    Need for influenza vaccination       Relevant Orders   Flu Vaccine QUAD 36+ mos PF IM (Fluarix & Fluzone Quad PF) (Completed)       Meds  ordered this encounter  Medications  . Naltrexone-Bupropion HCl ER 8-90 MG TB12    Sig: One by mouth daily x 1 week, then 1 pill BID x 1 week, then 2 pills in AM and 1 pill in PM x 1 week, then 2 pills BID    Dispense:  78 tablet    Refill:  0   Orders Placed This Encounter  Procedures  . MM DIGITAL SCREENING BILATERAL    Standing Status:   Future    Standing Expiration Date:   05/31/2017    Order Specific Question:   Reason for Exam (SYMPTOM  OR DIAGNOSIS REQUIRED)    Answer:   screening    Order Specific Question:   Is the patient pregnant?    Answer:   No    Order Specific Question:   Preferred imaging location?    Answer:   [facility]  . Flu Vaccine QUAD 36+ mos PF IM (Fluarix & Fluzone Quad PF)  . CBC with Differential/Platelet  . Comprehensive metabolic panel  . Hemoglobin A1c  . Lipid panel  . TSH   Follow up plan: Return in about 1 month (around 07/01/2016) for for medicine check; 1 year for physical.  An After Visit Summary was printed and given to the patient.

## 2016-05-31 NOTE — Assessment & Plan Note (Addendum)
USPSTF grade A and B recommendations reviewed with patient; age-appropriate recommendations, preventive care, screening tests, etc discussed and encouraged; healthy living encouraged; see AVS for patient education given to patient  

## 2016-05-31 NOTE — Assessment & Plan Note (Signed)
Order for mammo

## 2016-05-31 NOTE — Assessment & Plan Note (Signed)
Will start contrave and see her back in 4-5 weeks

## 2016-06-09 LAB — COMPREHENSIVE METABOLIC PANEL
A/G RATIO: 2.1 (ref 1.2–2.2)
ALT: 25 IU/L (ref 0–32)
AST: 17 IU/L (ref 0–40)
Albumin: 4.5 g/dL (ref 3.5–5.5)
Alkaline Phosphatase: 78 IU/L (ref 39–117)
BUN/Creatinine Ratio: 13 (ref 9–23)
BUN: 11 mg/dL (ref 6–24)
Bilirubin Total: 0.3 mg/dL (ref 0.0–1.2)
CALCIUM: 9.1 mg/dL (ref 8.7–10.2)
CO2: 21 mmol/L (ref 18–29)
CREATININE: 0.88 mg/dL (ref 0.57–1.00)
Chloride: 104 mmol/L (ref 96–106)
GFR, EST AFRICAN AMERICAN: 84 mL/min/{1.73_m2} (ref >59)
GFR, EST NON AFRICAN AMERICAN: 73 mL/min/{1.73_m2} (ref >59)
Globulin, Total: 2.1 g/dL (ref 1.5–4.5)
Glucose: 93 mg/dL (ref 65–99)
POTASSIUM: 4.3 mmol/L (ref 3.5–5.2)
Sodium: 140 mmol/L (ref 134–144)
TOTAL PROTEIN: 6.6 g/dL (ref 6.0–8.5)

## 2016-06-09 LAB — LIPID PANEL
CHOLESTEROL TOTAL: 217 mg/dL — AB (ref 100–199)
Chol/HDL Ratio: 5.3 ratio units — ABNORMAL HIGH (ref 0.0–4.4)
HDL: 41 mg/dL (ref >39)
LDL CALC: 130 mg/dL — AB (ref 0–99)
TRIGLYCERIDES: 230 mg/dL — AB (ref 0–149)
VLDL CHOLESTEROL CAL: 46 mg/dL — AB (ref 5–40)

## 2016-06-09 LAB — CBC WITH DIFFERENTIAL/PLATELET
BASOS: 2 %
Basophils Absolute: 0.1 10*3/uL (ref 0.0–0.2)
EOS (ABSOLUTE): 0.2 10*3/uL (ref 0.0–0.4)
EOS: 4 %
HEMATOCRIT: 40.9 % (ref 34.0–46.6)
Hemoglobin: 13.5 g/dL (ref 11.1–15.9)
IMMATURE GRANS (ABS): 0 10*3/uL (ref 0.0–0.1)
IMMATURE GRANULOCYTES: 1 %
LYMPHS: 39 %
Lymphocytes Absolute: 2.1 10*3/uL (ref 0.7–3.1)
MCH: 29 pg (ref 26.6–33.0)
MCHC: 33 g/dL (ref 31.5–35.7)
MCV: 88 fL (ref 79–97)
Monocytes Absolute: 0.5 10*3/uL (ref 0.1–0.9)
Monocytes: 9 %
NEUTROS PCT: 45 %
Neutrophils Absolute: 2.5 10*3/uL (ref 1.4–7.0)
PLATELETS: 289 10*3/uL (ref 150–379)
RBC: 4.66 x10E6/uL (ref 3.77–5.28)
RDW: 13.7 % (ref 12.3–15.4)
WBC: 5.3 10*3/uL (ref 3.4–10.8)

## 2016-06-09 LAB — HEMOGLOBIN A1C
ESTIMATED AVERAGE GLUCOSE: 120 mg/dL
Hgb A1c MFr Bld: 5.8 % — ABNORMAL HIGH (ref 4.8–5.6)

## 2016-06-09 LAB — TSH: TSH: 1.74 u[IU]/mL (ref 0.450–4.500)

## 2016-06-10 ENCOUNTER — Telehealth: Payer: Self-pay | Admitting: Family Medicine

## 2016-06-10 NOTE — Telephone Encounter (Signed)
Patient would like to discontinue the RX contrave because it's making her feel sick.  Patient wanted to know how to tamper off of the medication.

## 2016-06-11 NOTE — Telephone Encounter (Signed)
We taper off just like we tapered on, by one pill a week For example, if she's taking one pill twice a day right now, go to one pill once a day for one week, then stop If she's just taking one pill a day, stop it completely Sorry it didn't work for her

## 2016-06-11 NOTE — Telephone Encounter (Signed)
Left detailed voicemail

## 2016-07-01 ENCOUNTER — Ambulatory Visit: Payer: 59 | Admitting: Family Medicine

## 2016-12-07 ENCOUNTER — Ambulatory Visit: Payer: 59 | Admitting: Family Medicine

## 2016-12-08 ENCOUNTER — Encounter: Payer: Self-pay | Admitting: Family Medicine

## 2016-12-08 ENCOUNTER — Ambulatory Visit (INDEPENDENT_AMBULATORY_CARE_PROVIDER_SITE_OTHER): Payer: 59 | Admitting: Family Medicine

## 2016-12-08 VITALS — BP 134/88 | HR 56 | Temp 98.9°F | Resp 14

## 2016-12-08 DIAGNOSIS — R7301 Impaired fasting glucose: Secondary | ICD-10-CM | POA: Diagnosis not present

## 2016-12-08 DIAGNOSIS — G4700 Insomnia, unspecified: Secondary | ICD-10-CM | POA: Diagnosis not present

## 2016-12-08 DIAGNOSIS — E782 Mixed hyperlipidemia: Secondary | ICD-10-CM

## 2016-12-08 DIAGNOSIS — R5383 Other fatigue: Secondary | ICD-10-CM

## 2016-12-08 DIAGNOSIS — R001 Bradycardia, unspecified: Secondary | ICD-10-CM

## 2016-12-08 NOTE — Assessment & Plan Note (Signed)
Resolved, no longer having trouble falling asleep

## 2016-12-08 NOTE — Assessment & Plan Note (Signed)
Likely multifactorial, with mother's death, change in schedule for 11 days, coming off of SSRI; will also check labs to evaluate for anemia, diabetes, thyroid disorder, vitamin deficiency; grief counseling suggested; I do not think she has narcolepsy or sleep apnea

## 2016-12-08 NOTE — Assessment & Plan Note (Signed)
Check labs 

## 2016-12-08 NOTE — Progress Notes (Signed)
BP 134/88   Pulse (!) 56   Temp 98.9 F (37.2 C) (Oral)   Resp 14   SpO2 97%    Subjective:    Patient ID: Julie Ayala, female    DOB: May 26, 1956, 61 y.o.   MRN: [id number]  HPI: Julie Ayala is a 61 y.o. female  Chief Complaint  Patient presents with  . Fatigue    since mother has passed away.  She is sleeping well at night, but feels very tired during the day.    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 Surrounded herself with other women who have lost their mothers, 70 months or 10 years ago, God has surrounded her with those people for support; she is able to talk things out Feels tired during the day; not ready to fall asleep when sitting Just exhaustion more than fatigue or sleepiness Has very vivid dreams at night, related to her mother and death certificate, her insurance Routine is still the same Was off of her medicines and routine for about 11 days, but back on routine,  Hard time waking up since she messed her routine up staying awake all night; was like working 3rd shift Was at the hospital the last week, hardly went home except for needing sleep Had been tapering herself down on the prozac to 20 mg daily over the last 5-6 weeks; she did that on her own; gained 12 pounds over last 6 days; excessively puffy, bingeing Pulse has slowed down from 70 to 67 to 56 today; going to the beach every other weekend, contractor and doing new plumbing, new floors, painting, really active on those weekends When it's time to eat, she goes to restaurant and eats too much; they ask every 3 hours what are we eating Does take time to go walk on the beach; more active; really has increased activity last 3 months Some tingling on 2nd and 3rd fingers in right hand for 3 days She did 20 mg daily for a few weeks, then took every other day, then maybe 10 mg alternating with 20 mg, then 10 mg  daily; none at all for the last few days Rare use of advil Not snoring or gasping for air during sleep; no reports of sleep apnea  Depression screen [facility] 2/9 12/08/2016 05/31/2016 03/10/2016 11/19/2015 07/18/2015  Decreased Interest 0 0 0 0 0  Down, Depressed, Hopeless 0 0 0 0 0  PHQ - 2 Score 0 0 0 0 0   Relevant past medical, surgical, family and social history reviewed Past Medical History:  Diagnosis Date  . Allergy   . Asthma   . Asthma, mild intermittent, well-controlled 07/19/2015  . Binge eating disorder   . 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 History of chest pain    nuclear med stress test Sep 25, 2014 negative, EF 69%  . Hyperlipidemia   . IFG (impaired fasting glucose)   . Insomnia   . OCD (obsessive compulsive disorder)   . Post-menopausal   . Sciatica    Past Surgical History:  Procedure Laterality Date  . CHOLECYSTECTOMY  2013  . COLONOSCOPY  06/2012   small polyps, repeat in 06/2016  . CYSTECTOMY     uterine cyst removed  . CYSTECTOMY     Hyoid cyst removed  . MELANOMA EXCISION     Family History  Problem Relation Age of Onset  .  Hypertension Mother   . Heart disease Mother     pacemaker  . Congestive Heart Failure Mother     with pacemaker  . Stroke Mother     TIA/CVA  . Cancer Mother     lung  . Cancer Father     colon and lung  . Hypertension Sister   . Mental illness Sister     anxiety  . Hypothyroidism Sister   . Heart disease Brother     unknown heart condition name, EF 55%  . Hypertension Brother   . Cancer Son     melanoma  . Diabetes Paternal Aunt   . Diabetes Paternal Uncle   . Hypertension Brother   . Heart Problems Son     prolonged QT syndrome (syncope)  . Stroke Maternal Grandmother   . COPD Neg Hx   MD note: mother died 12/15/2016  Social History  Substance Use Topics  . Smoking status: Former Smoker    Years: 0.50    Types: Cigarettes    Quit date: 03/14/1987  . Smokeless tobacco: Never Used  . Alcohol use No    Interim medical history since last visit reviewed. Allergies and medications reviewed  Review of Systems Per HPI unless specifically indicated above     Objective:    BP 134/88   Pulse (!) 56   Temp 98.9 F (37.2 C) (Oral)   Resp 14   SpO2 97%   Patient refused to weigh today I understand   Physical Exam  Constitutional: She appears well-developed and well-nourished.  HENT:  Mouth/Throat: Mucous membranes are normal.  Eyes: EOM are normal. No scleral icterus.  Cardiovascular: Normal rate and regular rhythm.   Pulmonary/Chest: Effort normal and breath sounds normal.  Musculoskeletal: She exhibits no edema.  Neurological: She is alert. She displays no tremor.  Reflex Scores:      Patellar reflexes are 2+ on the right side and 2+ on the left side. No facial asymmetry; no tics  Skin:  No flushing  Psychiatric: She has a normal mood and affect. Her behavior is normal. Her mood appears not anxious. Her affect is not blunt and not inappropriate. Her speech is not delayed. She is not slowed and not withdrawn. Cognition and memory are not impaired. She does not express impulsivity. She does not exhibit a depressed mood.  Good eye contact She is attentive.      Assessment & Plan:   Problem List Items Addressed This Visit      Endocrine   IFG (impaired fasting glucose)    Check labs      Relevant Orders   HgB A1c   COMPLETE METABOLIC PANEL WITH GFR     Other   Insomnia    Resolved, no longer having trouble falling asleep      Relevant Orders   Vitamin D (25 hydroxy)   B12   COMPLETE METABOLIC PANEL WITH GFR   CBC with Differential/Platelet   TSH   Hyperlipidemia    Check labs      Relevant Orders   Lipid panel   Fatigue - Primary    Likely multifactorial, with mother's death, change in schedule for 11 days, coming off of SSRI; will also check labs to evaluate for anemia, diabetes, thyroid disorder, vitamin deficiency; grief counseling suggested; I do not  think she has narcolepsy or sleep apnea      Relevant Orders   Vitamin D (25 hydroxy)   B12   COMPLETE METABOLIC PANEL  WITH GFR   CBC with Differential/Platelet   TSH    Other Visit Diagnoses    Bradycardia       check TSH, as heart rate has slowed gradually over last 6 months with weight gain and fatigue   Relevant Orders   COMPLETE METABOLIC PANEL WITH GFR   CBC with Differential/Platelet   TSH      Follow up plan: Return in about 4 weeks (around 01/05/2017) for follow-up.  An after-visit summary was printed and given to the patient at [facility].  Please see the patient instructions which may contain other information and recommendations beyond what is mentioned above in the assessment and plan.  Meds ordered this encounter  Medications  . FLUoxetine (PROZAC) 20 MG tablet    Sig: Take 1 tablet (20 mg total) by mouth daily.    Orders Placed This Encounter  Procedures  . Vitamin D (25 hydroxy)  . B12  . Lipid panel  . HgB A1c  . COMPLETE METABOLIC PANEL WITH GFR  . CBC with Differential/Platelet  . TSH

## 2016-12-08 NOTE — Patient Instructions (Signed)
Check out [facility] Let's get labs today Start back on 20 mg of fluoxetine daily

## 2016-12-09 ENCOUNTER — Other Ambulatory Visit: Payer: Self-pay | Admitting: Family Medicine

## 2016-12-09 LAB — COMPREHENSIVE METABOLIC PANEL
ALK PHOS: 79 IU/L (ref 39–117)
ALT: 39 IU/L — AB (ref 0–32)
AST: 25 IU/L (ref 0–40)
Albumin/Globulin Ratio: 2.2 (ref 1.2–2.2)
Albumin: 4.6 g/dL (ref 3.5–5.5)
BILIRUBIN TOTAL: 0.2 mg/dL (ref 0.0–1.2)
BUN/Creatinine Ratio: 11 (ref 9–23)
BUN: 8 mg/dL (ref 6–24)
CALCIUM: 9.2 mg/dL (ref 8.7–10.2)
CHLORIDE: 102 mmol/L (ref 96–106)
CO2: 24 mmol/L (ref 18–29)
Creatinine, Ser: 0.75 mg/dL (ref 0.57–1.00)
GFR calc Af Amer: 102 mL/min/{1.73_m2} (ref >59)
GFR calc non Af Amer: 88 mL/min/{1.73_m2} (ref >59)
GLOBULIN, TOTAL: 2.1 g/dL (ref 1.5–4.5)
Glucose: 94 mg/dL (ref 65–99)
POTASSIUM: 4.7 mmol/L (ref 3.5–5.2)
Sodium: 141 mmol/L (ref 134–144)
Total Protein: 6.7 g/dL (ref 6.0–8.5)

## 2016-12-09 LAB — CBC WITH DIFFERENTIAL/PLATELET
BASOS ABS: 0.1 10*3/uL (ref 0.0–0.2)
Basos: 1 %
EOS (ABSOLUTE): 0.3 10*3/uL (ref 0.0–0.4)
Eos: 5 %
Hematocrit: 42.2 % (ref 34.0–46.6)
Hemoglobin: 13.7 g/dL (ref 11.1–15.9)
IMMATURE GRANS (ABS): 0 10*3/uL (ref 0.0–0.1)
Immature Granulocytes: 0 %
LYMPHS: 40 %
Lymphocytes Absolute: 2.4 10*3/uL (ref 0.7–3.1)
MCH: 28.3 pg (ref 26.6–33.0)
MCHC: 32.5 g/dL (ref 31.5–35.7)
MCV: 87 fL (ref 79–97)
MONOS ABS: 0.5 10*3/uL (ref 0.1–0.9)
Monocytes: 7 %
NEUTROS ABS: 2.9 10*3/uL (ref 1.4–7.0)
NEUTROS PCT: 47 %
PLATELETS: 324 10*3/uL (ref 150–379)
RBC: 4.84 x10E6/uL (ref 3.77–5.28)
RDW: 13.8 % (ref 12.3–15.4)
WBC: 6.2 10*3/uL (ref 3.4–10.8)

## 2016-12-09 LAB — LIPID PANEL
CHOL/HDL RATIO: 5.3 ratio — AB (ref 0.0–4.4)
Cholesterol, Total: 250 mg/dL — ABNORMAL HIGH (ref 100–199)
HDL: 47 mg/dL (ref >39)
LDL Calculated: 158 mg/dL — ABNORMAL HIGH (ref 0–99)
Triglycerides: 223 mg/dL — ABNORMAL HIGH (ref 0–149)
VLDL Cholesterol Cal: 45 mg/dL — ABNORMAL HIGH (ref 5–40)

## 2016-12-09 LAB — HEMOGLOBIN A1C
ESTIMATED AVERAGE GLUCOSE: 120 mg/dL
HEMOGLOBIN A1C: 5.8 % — AB (ref 4.8–5.6)

## 2016-12-09 LAB — TSH: TSH: 2.45 u[IU]/mL (ref 0.450–4.500)

## 2016-12-09 LAB — VITAMIN D 25 HYDROXY (VIT D DEFICIENCY, FRACTURES): VIT D 25 HYDROXY: 32.7 ng/mL (ref 30.0–100.0)

## 2016-12-09 LAB — VITAMIN B12: Vitamin B-12: 557 pg/mL (ref 232–1245)

## 2016-12-09 MED ORDER — EZETIMIBE 10 MG PO TABS: 10.0000 mg | ORAL_TABLET | Freq: Every day | ORAL | 3 refills | Status: DC

## 2016-12-09 NOTE — Progress Notes (Signed)
Start zetia for cholesterol

## 2017-01-04 ENCOUNTER — Ambulatory Visit
Admission: RE | Admit: 2017-01-04 | Discharge: 2017-01-04 | Disposition: A | Discharge status: Home / Self Care | Payer: Self-pay | Source: Ambulatory Visit | Attending: Family Medicine | Admitting: Family Medicine

## 2017-01-04 DIAGNOSIS — Z1231 Encounter for screening mammogram for malignant neoplasm of breast: Secondary | ICD-10-CM | POA: Insufficient documentation

## 2017-01-04 LAB — HM MAMMOGRAPHY: HM Mammogram: NORMAL (ref 0–4)

## 2017-04-04 ENCOUNTER — Ambulatory Visit: Payer: 59

## 2017-04-26 ENCOUNTER — Ambulatory Visit (INDEPENDENT_AMBULATORY_CARE_PROVIDER_SITE_OTHER): Payer: 59 | Admitting: Family Medicine

## 2017-04-26 ENCOUNTER — Encounter: Payer: Self-pay | Admitting: Family Medicine

## 2017-04-26 VITALS — BP 124/86 | HR 82 | Temp 98.1°F | Resp 16 | Wt 205.6 lb

## 2017-04-26 DIAGNOSIS — J309 Allergic rhinitis, unspecified: Secondary | ICD-10-CM | POA: Diagnosis not present

## 2017-04-26 DIAGNOSIS — H6981 Other specified disorders of Eustachian tube, right ear: Secondary | ICD-10-CM | POA: Diagnosis not present

## 2017-04-26 DIAGNOSIS — R42 Dizziness and giddiness: Secondary | ICD-10-CM | POA: Diagnosis not present

## 2017-04-26 MED ORDER — FLUTICASONE PROPIONATE 50 MCG/ACT NA SUSP: 2.0000 | Freq: Every day | NASAL | 2 refills | Status: DC

## 2017-04-26 MED ORDER — MECLIZINE HCL 12.5 MG PO TABS: 12.5000 mg | ORAL_TABLET | Freq: Three times a day (TID) | ORAL | 0 refills | Status: DC | PRN

## 2017-04-26 NOTE — Progress Notes (Addendum)
Name: Julie Ayala   MRN: [id number]    DOB: 1955/11/01   Date:04/26/2017       Progress Note  Subjective  Chief Complaint  Chief Complaint  Patient presents with  . Dizziness    with ear and cheek pain onright side    HPI  PT reports dizziness that began 5 days ago in the evening, then continued into the next 2 days.  She has now felt better for about 2.5 days.  BP on the first day was checked at home and was 140/80.  RIGHT ear started hurting her yesterday - sharp pain and slightly itchy; also having RIGHT maxillary pain.  She has a history of 2 episodes of vertigo many years ago; no history of stroke or TIA.Marland Kitchen She also has allergies - usually nasal congestion/post-nasal drip, and ear pain/congestion - will take benadryl children's as needed because she gets nauseated and anxious on adult dosing of benadryl and Claritin; has never used Flonase.  States dizziness has now completely resolved - during her episode she did not have any confusion, falls, extremity weakness, facial droop, or difficulty speaking.  Patient Active Problem List   Diagnosis Date Noted  . Fatigue 12/08/2016  . Visit for screening mammogram 05/31/2016  . Preventative health care 05/31/2016  . Obesity 05/31/2016  . Encounter for medication monitoring 05/24/2016  . Controlled substance agreement signed 03/14/2016  . Asthma, mild intermittent, well-controlled 07/19/2015  . Hyperlipidemia   . Sciatica   . OCD (obsessive compulsive disorder)   . Binge eating disorder   . Allergy   . IFG (impaired fasting glucose)   . Insomnia   . Clark level III melanoma [facility])     Social History  Substance Use Topics  . Smoking status: Former Smoker    Years: 0.50    Types: Cigarettes    Quit date: 03/14/1987  . Smokeless tobacco: Never Used  . Alcohol use No     Current Outpatient Prescriptions:  .  albuterol (PROVENTIL HFA;VENTOLIN HFA) 108 (90 Base) MCG/ACT inhaler, Inhale 2 puffs into the lungs every 4 (four) hours as  needed for wheezing or shortness of breath., Disp: 1 Inhaler, Rfl: 1 .  budesonide-formoterol (SYMBICORT) 160-4.5 MCG/ACT inhaler, Inhale 2 puffs into the lungs 2 (two) times daily., Disp: 3 Inhaler, Rfl: 3 .  ezetimibe (ZETIA) 10 MG tablet, Take 1 tablet (10 mg total) by mouth daily., Disp: 90 tablet, Rfl: 3 .  ibuprofen (ADVIL,MOTRIN) 600 MG tablet, TAKE 1 TABLET BY MOUTH  EVERY 8 HOURS AS NEEDED, Disp: 180 tablet, Rfl: 0 .  FLUoxetine (PROZAC) 20 MG tablet, Take 1 tablet (20 mg total) by mouth daily., Disp: , Rfl:   Allergies  Allergen Reactions  . Codeine Diarrhea and Nausea And Vomiting  . Latex   . Pravastatin Other (See Comments)    myalgias  . Sulfa Antibiotics Rash    ROS  Constitutional: Negative for fever or weight change.  Respiratory: Negative for cough and shortness of breath.   Cardiovascular: Negative for chest pain or palpitations.  Gastrointestinal: Negative for abdominal pain, no bowel changes.  Musculoskeletal: Negative for gait problem or joint swelling.  Skin: Negative for rash.  Neurological: Negative for dizziness or headache.  No other specific complaints in a complete review of systems (except as listed in HPI above).  Objective  Vitals:   04/26/17 1121  BP: 124/86  Pulse: 82  Resp: 16  Temp: 98.1 F (36.7 C)  TempSrc: Oral  SpO2: 96%  Weight: 205  lb 9.6 oz (93.3 kg)   Body mass index is 35.29 kg/m.  Nursing Note and Vital Signs reviewed.  Physical Exam  Constitutional: Patient appears well-developed and well-nourished. Obese. No distress.  HEENT: head atraumatic, normocephalic, pupils equal and reactive to light, EOM's intact, TM's without erythema or bulging, no maxillary or frontal sinus pain on palpation, neck supple without lymphadenopathy, oropharynx pink and moist without exudate Cardiovascular: Normal rate, regular rhythm, S1/S2 present.  No murmur or rub heard. No BLE edema. Pulmonary/Chest: Effort normal and breath sounds clear. No  respiratory distress or retractions. Musculoskeletal: Normal range of motion, no joint effusions. No gross deformities Neurological: she is alert and oriented to person, place, and time. No cranial nerve deficit. Coordination, balance, strength, speech and gait are normal. Negative Dix-hall-pike bilaterally. No nystagmus, no ataxia. Skin: Skin is warm and dry. No rash noted. No erythema.  Psychiatric: Patient has a normal mood and affect. behavior is normal. Judgment and thought content normal.  No results found for this or any previous visit (from the past 2160 hour(s)).   Assessment & Plan  1. Vertigo - meclizine (ANTIVERT) 12.5 MG tablet; Take 1 tablet (12.5 mg total) by mouth 3 (three) times daily as needed for dizziness.  Dispense: 10 tablet; Refill: 0 - Home [facility] Handout provided and reviewed in detail with patient.  2. Allergic rhinitis, unspecified seasonality, unspecified trigger - fluticasone (FLONASE) 50 MCG/ACT nasal spray; Place 2 sprays into both nostrils daily.  Dispense: 16 g; Refill: 2  3. Dysfunction of right eustachian tube - fluticasone (FLONASE) 50 MCG/ACT nasal spray; Place 2 sprays into both nostrils daily.  Dispense: 16 g; Refill: 2  -Red flags and when to present for emergency care or RTC including fever >101.110F, confusion, weakness, facial droop, speech difficulty, new/worsening/un-resolving symptoms, reviewed with patient at time of visit. Follow up and care instructions discussed and provided in AVS.  I have reviewed this encounter including the documentation in this note and/or discussed this patient with the Johney Maine, FNP, NP-C. I am certifying that I agree with the content of this note as supervising physician.  Steele Sizer, MD [facility] 04/27/2017, 4:36 PM

## 2017-04-26 NOTE — Patient Instructions (Addendum)

## 2017-05-03 ENCOUNTER — Telehealth: Payer: Self-pay | Admitting: Emergency Medicine
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
## 2017-05-04 MED ORDER — BUSPIRONE HCL 5 MG PO TABS: 5.0000 mg | ORAL_TABLET | Freq: Two times a day (BID) | ORAL | 0 refills | Status: DC | PRN

## 2017-05-04 NOTE — Telephone Encounter (Signed)
Rx sent to local pharmacy Appointment if further medicine/treatment needed [facility] all is well with her home

## 2017-05-04 NOTE — Telephone Encounter (Signed)
I will forward this concern to Dr. Sanda Klein as she is her PCP. Thank you!

## 2017-05-04 NOTE — Telephone Encounter (Signed)
Left detailed voicemail

## 2017-05-16 ENCOUNTER — Telehealth: Payer: Self-pay | Admitting: Family Medicine

## 2017-05-16 ENCOUNTER — Ambulatory Visit: Payer: 59 | Admitting: Family Medicine

## 2017-05-16 MED ORDER — BUSPIRONE HCL 5 MG PO TABS: 5.0000 mg | ORAL_TABLET | Freq: Two times a day (BID) | ORAL | 0 refills | Status: DC | PRN

## 2017-05-16 NOTE — Telephone Encounter (Signed)
Message left for patient; sent a limited Rx in, hope to see her soon for evaluation, discuss treatment
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
## 2017-05-17 ENCOUNTER — Other Ambulatory Visit: Payer: Self-pay | Admitting: Family Medicine

## 2017-05-17 ENCOUNTER — Other Ambulatory Visit: Payer: Self-pay

## 2017-05-17 MED ORDER — BUSPIRONE HCL 7.5 MG PO TABS: 7.5000 mg | ORAL_TABLET | Freq: Two times a day (BID) | ORAL | 0 refills | Status: DC | PRN

## 2017-05-17 MED ORDER — BUSPIRONE HCL 5 MG PO TABS: 7.5000 mg | ORAL_TABLET | Freq: Two times a day (BID) | ORAL | 0 refills | Status: DC | PRN

## 2017-05-17 NOTE — Progress Notes (Signed)
5 mg pills not available; patient's pharmacy requested 7.5 mg pills I sent a new Rx to pharmacy

## 2017-05-18 ENCOUNTER — Other Ambulatory Visit: Payer: Self-pay | Admitting: Family Medicine

## 2017-05-18 MED ORDER — BUSPIRONE HCL 7.5 MG PO TABS: 7.5000 mg | ORAL_TABLET | Freq: Two times a day (BID) | ORAL | 0 refills | Status: DC | PRN

## 2017-05-18 NOTE — Progress Notes (Signed)
Resent to local pharmacy

## 2017-05-23 ENCOUNTER — Ambulatory Visit (INDEPENDENT_AMBULATORY_CARE_PROVIDER_SITE_OTHER): Payer: 59 | Admitting: Family Medicine

## 2017-05-23 ENCOUNTER — Encounter: Payer: Self-pay | Admitting: Family Medicine

## 2017-05-23 VITALS — BP 124/82 | HR 73 | Temp 98.3°F | Resp 14 | Wt 206.3 lb

## 2017-05-23 DIAGNOSIS — F428 Other obsessive-compulsive disorder: Secondary | ICD-10-CM

## 2017-05-23 DIAGNOSIS — F5081 Binge eating disorder: Secondary | ICD-10-CM

## 2017-05-23 DIAGNOSIS — Z23 Encounter for immunization: Secondary | ICD-10-CM | POA: Diagnosis not present

## 2017-05-23 DIAGNOSIS — E6609 Other obesity due to excess calories: Secondary | ICD-10-CM

## 2017-05-23 DIAGNOSIS — Z6835 Body mass index (BMI) 35.0-35.9, adult: Secondary | ICD-10-CM | POA: Diagnosis not present

## 2017-05-23 MED ORDER — BUSPIRONE HCL 7.5 MG PO TABS: 7.5000 mg | ORAL_TABLET | Freq: Two times a day (BID) | ORAL | 1 refills | Status: DC | PRN

## 2017-05-23 MED ORDER — EZETIMIBE 10 MG PO TABS: 10.0000 mg | ORAL_TABLET | Freq: Every day | ORAL | 3 refills | Status: DC

## 2017-05-23 NOTE — Patient Instructions (Signed)
I'll suggest having you see a psychiatrist, and let me know if we can help with that referral
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
## 2017-05-25 NOTE — Assessment & Plan Note (Signed)
Filled out paperwork for her to address her health in other ways besides focusing on weight loss; see paper for her work; discussed having her see psychiatrist

## 2017-05-25 NOTE — Assessment & Plan Note (Signed)
Refer to psychiatrist; specialist may consider luvox or other medicine to help with OCD which likely impacts her binge eating d/o

## 2017-05-25 NOTE — Assessment & Plan Note (Signed)
Patient struggles with binge eating disorder which impacts her weight

## 2017-07-19 ENCOUNTER — Ambulatory Visit (INDEPENDENT_AMBULATORY_CARE_PROVIDER_SITE_OTHER): Payer: 59 | Admitting: Family Medicine

## 2017-07-19 ENCOUNTER — Encounter: Payer: Self-pay | Admitting: Family Medicine

## 2017-07-19 VITALS — BP 146/82 | HR 62 | Temp 98.4°F | Resp 16 | Ht 63.75 in | Wt 206.9 lb

## 2017-07-19 DIAGNOSIS — R740 Nonspecific elevation of levels of transaminase and lactic acid dehydrogenase [LDH]: Secondary | ICD-10-CM | POA: Diagnosis not present

## 2017-07-19 DIAGNOSIS — Z1211 Encounter for screening for malignant neoplasm of colon: Secondary | ICD-10-CM | POA: Diagnosis not present

## 2017-07-19 DIAGNOSIS — Z0000 Encounter for general adult medical examination without abnormal findings: Secondary | ICD-10-CM

## 2017-07-19 DIAGNOSIS — R7401 Elevation of levels of liver transaminase levels: Secondary | ICD-10-CM | POA: Insufficient documentation

## 2017-07-19 DIAGNOSIS — Z124 Encounter for screening for malignant neoplasm of cervix: Secondary | ICD-10-CM

## 2017-07-19 MED ORDER — HYDROCHLOROTHIAZIDE 12.5 MG PO CAPS: 12.5000 mg | ORAL_CAPSULE | Freq: Every day | ORAL | 0 refills | Status: DC

## 2017-07-19 NOTE — Assessment & Plan Note (Signed)
Likely fatty liver, check

## 2017-07-19 NOTE — Patient Instructions (Addendum)
Let's start the HCTZ Two weeks after you start it, then go to have fasting labs done Please monitor your blood pressure in a week and again in two weeks and call me with readings  Health Maintenance, Female Adopting a healthy lifestyle and getting preventive care can go a long way to promote health and wellness. Talk with your health care provider about what schedule of regular examinations is right for you. This is a good chance for you to check in with your provider about disease prevention and staying healthy. In between checkups, there are plenty of things you can do on your own. Experts have done a lot of research about which lifestyle changes and preventive measures are most likely to keep you healthy. Ask your health care provider for more information. Weight and diet Eat a healthy diet  Be sure to include plenty of vegetables, fruits, low-fat dairy products, and lean protein.  Do not eat a lot of foods high in solid fats, added sugars, or salt.  Get regular exercise. This is one of the most important things you can do for your health. ? Most adults should exercise for at least 150 minutes each week. The exercise should increase your heart rate and make you sweat (moderate-intensity exercise). ? Most adults should also do strengthening exercises at least twice a week. This is in addition to the moderate-intensity exercise.  Maintain a healthy weight  Body mass index (BMI) is a measurement that can be used to identify possible weight problems. It estimates body fat based on height and weight. Your health care provider can help determine your BMI and help you achieve or maintain a healthy weight.  For females 30 years of age and older: ? A BMI below 18.5 is considered underweight. ? A BMI of 18.5 to 24.9 is normal. ? A BMI of 25 to 29.9 is considered overweight. ? A BMI of 30 and above is considered obese.  Watch levels of cholesterol and blood lipids  You should start having your  blood tested for lipids and cholesterol at 59 years of age, then have this test every 5 years.  You may need to have your cholesterol levels checked more often if: ? Your lipid or cholesterol levels are high. ? You are older than 59 years of age. ? You are at high risk for heart disease.  Cancer screening Lung Cancer  Lung cancer screening is recommended for adults 66-57 years old who are at high risk for lung cancer because of a history of smoking.  A yearly low-dose CT scan of the lungs is recommended for people who: ? Currently smoke. ? Have quit within the past 15 years. ? Have at least a 30-pack-year history of smoking. A pack year is smoking an average of one pack of cigarettes a day for 1 year.  Yearly screening should continue until it has been 15 years since you quit.  Yearly screening should stop if you develop a health problem that would prevent you from having lung cancer treatment.  Breast Cancer  Practice breast self-awareness. This means understanding how your breasts normally appear and feel.  It also means doing regular breast self-exams. Let your health care provider know about any changes, no matter how small.  If you are in your 20s or 30s, you should have a clinical breast exam (CBE) by a health care provider every 1-3 years as part of a regular health exam.  If you are 66 or older, have a CBE every  year. Also consider having a breast X-ray (mammogram) every year.  If you have a family history of breast cancer, talk to your health care provider about genetic screening.  If you are at high risk for breast cancer, talk to your health care provider about having an MRI and a mammogram every year.  Breast cancer gene (BRCA) assessment is recommended for women who have family members with BRCA-related cancers. BRCA-related cancers include: ? Breast. ? Ovarian. ? Tubal. ? Peritoneal cancers.  Results of the assessment will determine the need for genetic  counseling and BRCA1 and BRCA2 testing.  Cervical Cancer Your health care provider may recommend that you be screened regularly for cancer of the pelvic organs (ovaries, uterus, and vagina). This screening involves a pelvic examination, including checking for microscopic changes to the surface of your cervix (Pap test). You may be encouraged to have this screening done every 3 years, beginning at age 21.  For women ages 30-65, health care providers may recommend pelvic exams and Pap testing every 3 years, or they may recommend the Pap and pelvic exam, combined with testing for human papilloma virus (HPV), every 5 years. Some types of HPV increase your risk of cervical cancer. Testing for HPV may also be done on women of any age with unclear Pap test results.  Other health care providers may not recommend any screening for nonpregnant women who are considered low risk for pelvic cancer and who do not have symptoms. Ask your health care provider if a screening pelvic exam is right for you.  If you have had past treatment for cervical cancer or a condition that could lead to cancer, you need Pap tests and screening for cancer for at least 20 years after your treatment. If Pap tests have been discontinued, your risk factors (such as having a new sexual partner) need to be reassessed to determine if screening should resume. Some women have medical problems that increase the chance of getting cervical cancer. In these cases, your health care provider may recommend more frequent screening and Pap tests.  Colorectal Cancer  This type of cancer can be detected and often prevented.  Routine colorectal cancer screening usually begins at 59 years of age and continues through 59 years of age.  Your health care provider may recommend screening at an earlier age if you have risk factors for colon cancer.  Your health care provider may also recommend using home test kits to check for hidden blood in the  stool.  A small camera at the end of a tube can be used to examine your colon directly (sigmoidoscopy or colonoscopy). This is done to check for the earliest forms of colorectal cancer.  Routine screening usually begins at age 50.  Direct examination of the colon should be repeated every 5-10 years through 59 years of age. However, you may need to be screened more often if early forms of precancerous polyps or small growths are found.  Skin Cancer  Check your skin from head to toe regularly.  Tell your health care provider about any new moles or changes in moles, especially if there is a change in a mole's shape or color.  Also tell your health care provider if you have a mole that is larger than the size of a pencil eraser.  Always use sunscreen. Apply sunscreen liberally and repeatedly throughout the day.  Protect yourself by wearing long sleeves, pants, a wide-brimmed hat, and sunglasses whenever you are outside.  Heart disease, diabetes, and   high blood pressure  High blood pressure causes heart disease and increases the risk of stroke. High blood pressure is more likely to develop in: ? People who have blood pressure in the high end of the normal range (130-139/85-89 mm Hg). ? People who are overweight or obese. ? People who are African American.  If you are 42-20 years of age, have your blood pressure checked every 3-5 years. If you are 77 years of age or older, have your blood pressure checked every year. You should have your blood pressure measured twice-once when you are at a hospital or clinic, and once when you are not at a hospital or clinic. Record the average of the two measurements. To check your blood pressure when you are not at a hospital or clinic, you can use: ? An automated blood pressure machine at a pharmacy. ? A home blood pressure monitor.  If you are between 19 years and 65 years old, ask your health care provider if you should take aspirin to prevent  strokes.  Have regular diabetes screenings. This involves taking a blood sample to check your fasting blood sugar level. ? If you are at a normal weight and have a low risk for diabetes, have this test once every three years after 59 years of age. ? If you are overweight and have a high risk for diabetes, consider being tested at a younger age or more often. Preventing infection Hepatitis B  If you have a higher risk for hepatitis B, you should be screened for this virus. You are considered at high risk for hepatitis B if: ? You were born in a country where hepatitis B is common. Ask your health care provider which countries are considered high risk. ? Your parents were born in a high-risk country, and you have not been immunized against hepatitis B (hepatitis B vaccine). ? You have HIV or AIDS. ? You use needles to inject street drugs. ? You live with someone who has hepatitis B. ? You have had sex with someone who has hepatitis B. ? You get hemodialysis treatment. ? You take certain medicines for conditions, including cancer, organ transplantation, and autoimmune conditions.  Hepatitis C  Blood testing is recommended for: ? Everyone born from [birth year] through 1963. ? Anyone with known risk factors for hepatitis C.  Sexually transmitted infections (STIs)  You should be screened for sexually transmitted infections (STIs) including gonorrhea and chlamydia if: ? You are sexually active and are younger than 59 years of age. ? You are older than 59 years of age and your health care provider tells you that you are at risk for this type of infection. ? Your sexual activity has changed since you were last screened and you are at an increased risk for chlamydia or gonorrhea. Ask your health care provider if you are at risk.  If you do not have HIV, but are at risk, it may be recommended that you take a prescription medicine daily to prevent HIV infection. This is called pre-exposure prophylaxis  (PrEP). You are considered at risk if: ? You are sexually active and do not regularly use condoms or know the HIV status of your partner(s). ? You take drugs by injection. ? You are sexually active with a partner who has HIV.  Talk with your health care provider about whether you are at high risk of being infected with HIV. If you choose to begin PrEP, you should first be tested for HIV. You should then be  tested every 3 months for as long as you are taking PrEP. Pregnancy  If you are premenopausal and you may become pregnant, ask your health care provider about preconception counseling.  If you may become pregnant, take 400 to 800 micrograms (mcg) of folic acid every day.  If you want to prevent pregnancy, talk to your health care provider about birth control (contraception). Osteoporosis and menopause  Osteoporosis is a disease in which the bones lose minerals and strength with aging. This can result in serious bone fractures. Your risk for osteoporosis can be identified using a bone density scan.  If you are 65 years of age or older, or if you are at risk for osteoporosis and fractures, ask your health care provider if you should be screened.  Ask your health care provider whether you should take a calcium or vitamin D supplement to lower your risk for osteoporosis.  Menopause may have certain physical symptoms and risks.  Hormone replacement therapy may reduce some of these symptoms and risks. Talk to your health care provider about whether hormone replacement therapy is right for you. Follow these instructions at home:  Schedule regular health, dental, and eye exams.  Stay current with your immunizations.  Do not use any tobacco products including cigarettes, chewing tobacco, or electronic cigarettes.  If you are pregnant, do not drink alcohol.  If you are breastfeeding, limit how much and how often you drink alcohol.  Limit alcohol intake to no more than 1 drink per day for  nonpregnant women. One drink equals 12 ounces of beer, 5 ounces of wine, or 1 ounces of hard liquor.  Do not use street drugs.  Do not share needles.  Ask your health care provider for help if you need support or information about quitting drugs.  Tell your health care provider if you often feel depressed.  Tell your health care provider if you have ever been abused or do not feel safe at home. This information is not intended to replace advice given to you by your health care provider. Make sure you discuss any questions you have with your health care provider. Document Released: 02/15/2011 Document Revised: 01/08/2016 Document Reviewed: 05/06/2015 Elsevier Interactive Patient Education  2018 [employer]  

## 2017-07-19 NOTE — Assessment & Plan Note (Signed)
Refer for colonoscopy 

## 2017-07-19 NOTE — Assessment & Plan Note (Signed)
USPSTF grade A and B recommendations reviewed with patient; age-appropriate recommendations, preventive care, screening tests, etc discussed and encouraged; healthy living encouraged; see AVS for patient education given to patient  

## 2017-07-19 NOTE — Progress Notes (Signed)
Patient ID: Julie Ayala, female   DOB: Dec 20, 1955, 61 y.o.   MRN: [id number]   Subjective:   Julie Ayala is a 61 y.o. female here for a complete physical exam  Interim issues since last visit:  No medical excitement  USPSTF grade A and B recommendations Depression:  Depression screen [facility] 2/9 07/19/2017 05/23/2017 04/26/2017 12/08/2016 05/31/2016  Decreased Interest 0 0 0 0 0  Down, Depressed, Hopeless 0 0 0 0 0  PHQ - 2 Score 0 0 0 0 0   Hypertension: chicken noodle soup, with low sodium BP Readings from Last 3 Encounters:  07/19/17 (!) 146/82  05/23/17 124/82  04/26/17 124/86   Obesity: Wt Readings from Last 3 Encounters:  07/19/17 206 lb 14.4 oz (93.8 kg)  05/23/17 206 lb 4.8 oz (93.6 kg)  04/26/17 205 lb 9.6 oz (93.3 kg)   BMI Readings from Last 3 Encounters:  07/19/17 35.79 kg/m  05/23/17 35.41 kg/m  04/26/17 35.29 kg/m    Skin cancer: melanoma, nothing worrisome now; going every 6 months to dermatology; nude tanning beds, au natural Lung cancer:  2 months smoking age 29 Breast cancer: no lumps; mammo UTD Colorectal cancer: ordered GI referral  BRCA gene screening: family hx of breast and/or ovarian cancer and/or metastatic prostate cancer? Mother breast Cervical cancer screening: today; remote abnormal 15 years or more; just repeated and normal HIV, hep B, hep C: not interested STD testing and prevention (chl/gon/syphilis): not interested Intimate partner violence: no abuse Contraception: postmen. Osteoporosis:  Occasional steroids Fall prevention/vitamin D: discussed  Diet: calcium, fiber, encouraged red meat limiting Exercise: 30 minutes daily most days, 3 floor stairs Alcohol: socially, picked up, margarita weekly Tobacco use: old, remote nothing Aspirin: no Lipids: just done through work, patient will send those for review Lab Results  Component Value Date   CHOL 250 (H) 12/08/2016   CHOL 217 (H) 06/08/2016   Lab Results  Component Value Date   HDL 47 12/08/2016   HDL 41 06/08/2016   Lab Results  Component Value Date   LDLCALC 158 (H) 12/08/2016   LDLCALC 130 (H) 06/08/2016   Lab Results  Component Value Date   TRIG 223 (H) 12/08/2016   TRIG 230 (H) 06/08/2016   Lab Results  Component Value Date   CHOLHDL 5.3 (H) 12/08/2016   CHOLHDL 5.3 (H) 06/08/2016   No results found for: LDLDIRECT Glucose:  Glucose  Date Value Ref Range Status  12/08/2016 94 65 - 99 mg/dL Final  06/08/2016 93 65 - 99 mg/dL Final   AAA: n/a   Past Medical History:  Diagnosis Date  . Allergy   . Asthma   . Asthma, mild intermittent, well-controlled 07/19/2015  . Binge eating disorder   . 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 History of chest pain    nuclear med stress test Sep 25, 2014 negative, EF 69%  . Hyperlipidemia   . IFG (impaired fasting glucose)   . Insomnia   . OCD (obsessive compulsive disorder)   . Post-menopausal    Past Surgical History:  Procedure Laterality Date  . BREAST BIOPSY Right    core bx- neg  . CHOLECYSTECTOMY  2013  . COLONOSCOPY  06/2012   small polyps, repeat in 06/2016  . CYSTECTOMY     uterine cyst removed  . CYSTECTOMY     Hyoid cyst removed  . MELANOMA EXCISION     *Patient does not remember colon polyp excision, cannot remove from above; please refer back to  original colonoscopy report*  Family History  Problem Relation Age of Onset  . Hypertension Mother   . Heart disease Mother        pacemaker  . Congestive Heart Failure Mother        with pacemaker  . Stroke Mother        TIA/CVA  . Cancer Mother        lung  . Breast cancer Mother 38  . Cancer Father        colon and lung  . Hypertension Sister   . Mental illness Sister        anxiety  . Hypothyroidism Sister   . Heart disease Brother        unknown heart condition name, EF 55%  . Hypertension Brother   . Cancer Son        melanoma  . Diabetes Paternal Aunt   . Diabetes Paternal Uncle   . Hypertension Brother   . Heart  Problems Son        prolonged QT syndrome (syncope)  . Stroke Maternal Grandmother   . COPD Maternal Aunt   . Cirrhosis Maternal Grandfather    Social History   Tobacco Use  . Smoking status: Former Smoker    Years: 0.50    Types: Cigarettes    Last attempt to quit: 03/14/1987    Years since quitting: 30.3  . Smokeless tobacco: Never Used  Substance Use Topics  . Alcohol use: Yes    Comment: occassional  . Drug use: No   Review of Systems  Constitutional: Negative for fever.  HENT: Negative for hearing loss (family thinks there may be an issue).   Eyes: Positive for visual disturbance (wears contacts).  Cardiovascular: Positive for leg swelling (ankles, "tremendous" at times).  Gastrointestinal: Negative for blood in stool.  Endocrine: Negative for polydipsia.  Genitourinary: Negative for hematuria.  Musculoskeletal:       Only if drinking diet sodas, has some joint aches  Skin:       No new moles  Allergic/Immunologic: Negative for food allergies.  Neurological: Negative for tremors.  Psychiatric/Behavioral: Negative for dysphoric mood.    Objective:   Vitals:   07/19/17 1559  BP: (!) 146/82  Pulse: 62  Resp: 16  Temp: 98.4 F (36.9 C)  TempSrc: Oral  SpO2: 99%  Weight: 206 lb 14.4 oz (93.8 kg)  Height: 5' 3.75" (1.619 m)   Body mass index is 35.79 kg/m. Wt Readings from Last 3 Encounters:  07/19/17 206 lb 14.4 oz (93.8 kg)  05/23/17 206 lb 4.8 oz (93.6 kg)  04/26/17 205 lb 9.6 oz (93.3 kg)   Physical Exam  Constitutional: She appears well-developed and well-nourished.  HENT:  Head: Normocephalic and atraumatic.  Eyes: Conjunctivae and EOM are normal. Right eye exhibits no hordeolum. Left eye exhibits no hordeolum. No scleral icterus.  Neck: Carotid bruit is not present. No thyromegaly present.  Cardiovascular: Normal rate, regular rhythm, S1 normal, S2 normal and normal heart sounds.  No extrasystoles are present.  Pulmonary/Chest: Effort normal and  breath sounds normal. No respiratory distress. Right breast exhibits no inverted nipple, no mass, no nipple discharge, no skin change and no tenderness. Left breast exhibits no inverted nipple, no mass, no nipple discharge, no skin change and no tenderness. Breasts are symmetrical.  Abdominal: Soft. Normal appearance and bowel sounds are normal. She exhibits no distension, no abdominal bruit, no pulsatile midline mass and no mass. There is no hepatosplenomegaly. There is no tenderness.  No hernia.  Genitourinary: Uterus normal. Pelvic exam was performed with patient prone. There is no rash or lesion on the right labia. There is no rash or lesion on the left labia. Cervix exhibits no motion tenderness. Right adnexum displays no mass, no tenderness and no fullness. Left adnexum displays no mass, no tenderness and no fullness.  Musculoskeletal: Normal range of motion. She exhibits no edema.  Lymphadenopathy:       Head (right side): No submandibular adenopathy present.       Head (left side): No submandibular adenopathy present.    She has no cervical adenopathy.    She has no axillary adenopathy.  Neurological: She is alert. She displays no tremor. No cranial nerve deficit. She exhibits normal muscle tone. Gait normal.  Skin: Skin is warm and dry. No bruising and no ecchymosis noted. No cyanosis. No pallor.  No darkly pigmented moles in the labial area, medial upper thighs; one flesh-colored nevus posteromedial RIGHT thigh  Psychiatric: Her speech is normal and behavior is normal. Thought content normal. Her mood appears not anxious. She does not exhibit a depressed mood.    Assessment/Plan:   Problem List Items Addressed This Visit      Other   Preventative health care - Primary    USPSTF grade A and B recommendations reviewed with patient; age-appropriate recommendations, preventive care, screening tests, etc discussed and encouraged; healthy living encouraged; see AVS for patient education given  to patient      Elevated serum glutamic pyruvic transaminase (SGPT) level    Likely fatty liver, check      Relevant Orders   Comprehensive Metabolic Panel (CMET)   Colon cancer screening    Refer for colonoscopy      Relevant Orders   Ambulatory referral to Gastroenterology    Other Visit Diagnoses    Pap smear for cervical cancer screening       Relevant Orders   Pap IG and HPV (high risk) DNA detection       Meds ordered this encounter  Medications  . hydrochlorothiazide (MICROZIDE) 12.5 MG capsule    Sig: Take 1 capsule (12.5 mg total) by mouth daily.    Dispense:  30 capsule    Refill:  0   Orders Placed This Encounter  Procedures  . Comprehensive Metabolic Panel (CMET)    Standing Status:   Future    Number of Occurrences:   1    Standing Expiration Date:   07/19/2018  . Ambulatory referral to Gastroenterology    Referral Priority:   Routine    Referral Type:   Consultation    Referral Reason:   Specialty Services Required    Number of Visits Requested:   1    Follow up plan: Return in about 1 year (around 07/19/2018) for complete physical.  An After Visit Summary was printed and given to the patient.

## 2017-07-22 LAB — PAP IG AND HPV HIGH-RISK
HPV, high-risk: NEGATIVE
PAP SMEAR COMMENT: 0

## 2017-07-22 LAB — SPECIMEN STATUS REPORT

## 2017-08-13 LAB — COMPREHENSIVE METABOLIC PANEL
ALT: 86 IU/L — AB (ref 0–32)
AST: 56 IU/L — AB (ref 0–40)
Albumin/Globulin Ratio: 2 (ref 1.2–2.2)
Albumin: 4.5 g/dL (ref 3.5–5.5)
Alkaline Phosphatase: 81 IU/L (ref 39–117)
BUN/Creatinine Ratio: 13 (ref 9–23)
BUN: 10 mg/dL (ref 6–24)
Bilirubin Total: 0.3 mg/dL (ref 0.0–1.2)
CALCIUM: 9.3 mg/dL (ref 8.7–10.2)
CO2: 21 mmol/L (ref 20–29)
CREATININE: 0.75 mg/dL (ref 0.57–1.00)
Chloride: 106 mmol/L (ref 96–106)
GFR, EST AFRICAN AMERICAN: 101 mL/min/{1.73_m2} (ref >59)
GFR, EST NON AFRICAN AMERICAN: 88 mL/min/{1.73_m2} (ref >59)
GLUCOSE: 98 mg/dL (ref 65–99)
Globulin, Total: 2.2 g/dL (ref 1.5–4.5)
Potassium: 4.4 mmol/L (ref 3.5–5.2)
Sodium: 141 mmol/L (ref 134–144)
TOTAL PROTEIN: 6.7 g/dL (ref 6.0–8.5)

## 2017-08-15 ENCOUNTER — Other Ambulatory Visit: Payer: Self-pay | Admitting: Family Medicine

## 2017-08-15 DIAGNOSIS — R748 Abnormal levels of other serum enzymes: Secondary | ICD-10-CM

## 2017-08-15 MED ORDER — LOSARTAN POTASSIUM 25 MG PO TABS: 25.0000 mg | ORAL_TABLET | Freq: Every day | ORAL | 0 refills | Status: DC

## 2017-08-15 NOTE — Progress Notes (Signed)
Stop HCTZ Start losartan Recheck CMP in 2 weeks, BP recheck with CMA

## 2017-08-16 ENCOUNTER — Other Ambulatory Visit: Payer: Self-pay | Admitting: Family Medicine

## 2017-08-16 DIAGNOSIS — R748 Abnormal levels of other serum enzymes: Secondary | ICD-10-CM

## 2017-08-17 ENCOUNTER — Other Ambulatory Visit: Payer: Self-pay | Admitting: Family Medicine

## 2017-08-17 DIAGNOSIS — R740 Nonspecific elevation of levels of transaminase and lactic acid dehydrogenase [LDH]: Principal | ICD-10-CM

## 2017-08-17 DIAGNOSIS — C439 Malignant melanoma of skin, unspecified: Secondary | ICD-10-CM

## 2017-08-17 DIAGNOSIS — R7401 Elevation of levels of liver transaminase levels: Secondary | ICD-10-CM

## 2017-08-17 NOTE — Telephone Encounter (Signed)
Called pt she states that she did not receive mychart message. Informed her of the mychart lab message. She is very surprised and does not know what elevated liver enzymes can mean. I do not see the liver ultrasound ordered, will order now.

## 2017-08-17 NOTE — Progress Notes (Signed)
Ordering liver US

## 2017-08-17 NOTE — Telephone Encounter (Signed)
Thank you :)

## 2017-08-17 NOTE — Telephone Encounter (Signed)
Rx request for HCTZ received I stopped this medicine Please call patient, encourage her to check her MyChart account for message Okay to read her the note

## 2017-08-18 ENCOUNTER — Encounter: Payer: Self-pay | Admitting: Family Medicine

## 2017-08-18 NOTE — Telephone Encounter (Signed)
Miel, can I put this request into your hands? Thank you

## 2017-08-18 NOTE — Telephone Encounter (Unsigned)
Copied from [place] [phone]. Topic: General - Other >> Aug 18, 2017  8:56 AM Aurelio Brash B wrote: Reason for CRM: Pt  has a liver u/s  scheduled   but pt has not been called and she thinks its because they have the wrong  insurance, her insurance changed jan 1.   Christella Scheuermann is her new insurance  ID number [id number]

## 2017-08-18 NOTE — Telephone Encounter (Signed)
Julie Ayala, please see her note with new insurance information

## 2017-08-19 ENCOUNTER — Encounter: Payer: Self-pay | Admitting: Family Medicine

## 2017-08-24 ENCOUNTER — Ambulatory Visit
Admission: RE | Admit: 2017-08-24 | Discharge: 2017-08-24 | Disposition: A | Discharge status: Home / Self Care | Payer: Managed Care, Other (non HMO) | Source: Ambulatory Visit | Attending: Family Medicine | Admitting: Family Medicine

## 2017-08-24 ENCOUNTER — Encounter: Payer: Self-pay | Admitting: Family Medicine

## 2017-08-24 DIAGNOSIS — R7401 Elevation of levels of liver transaminase levels: Secondary | ICD-10-CM

## 2017-08-24 DIAGNOSIS — Z9049 Acquired absence of other specified parts of digestive tract: Secondary | ICD-10-CM | POA: Insufficient documentation

## 2017-08-24 DIAGNOSIS — Z85820 Personal history of malignant melanoma of skin: Secondary | ICD-10-CM | POA: Insufficient documentation

## 2017-08-24 DIAGNOSIS — R740 Nonspecific elevation of levels of transaminase and lactic acid dehydrogenase [LDH]: Secondary | ICD-10-CM | POA: Insufficient documentation

## 2017-08-24 DIAGNOSIS — C439 Malignant melanoma of skin, unspecified: Secondary | ICD-10-CM

## 2017-08-24 DIAGNOSIS — K760 Fatty (change of) liver, not elsewhere classified: Secondary | ICD-10-CM

## 2017-08-24 HISTORY — DX: Fatty (change of) liver, not elsewhere classified: K76.0

## 2017-08-25 ENCOUNTER — Encounter: Payer: Self-pay | Admitting: Family Medicine

## 2017-08-25 ENCOUNTER — Ambulatory Visit: Payer: Managed Care, Other (non HMO) | Admitting: Family Medicine

## 2017-08-25 DIAGNOSIS — I10 Essential (primary) hypertension: Secondary | ICD-10-CM | POA: Diagnosis not present

## 2017-08-25 DIAGNOSIS — R740 Nonspecific elevation of levels of transaminase and lactic acid dehydrogenase [LDH]: Secondary | ICD-10-CM | POA: Diagnosis not present

## 2017-08-25 DIAGNOSIS — K760 Fatty (change of) liver, not elsewhere classified: Secondary | ICD-10-CM

## 2017-08-25 DIAGNOSIS — C439 Malignant melanoma of skin, unspecified: Secondary | ICD-10-CM

## 2017-08-25 DIAGNOSIS — R7301 Impaired fasting glucose: Secondary | ICD-10-CM

## 2017-08-25 DIAGNOSIS — E782 Mixed hyperlipidemia: Secondary | ICD-10-CM | POA: Diagnosis not present

## 2017-08-25 DIAGNOSIS — Z5181 Encounter for therapeutic drug level monitoring: Secondary | ICD-10-CM | POA: Diagnosis not present

## 2017-08-25 DIAGNOSIS — R7401 Elevation of levels of liver transaminase levels: Secondary | ICD-10-CM

## 2017-08-25 MED ORDER — LOSARTAN POTASSIUM 50 MG PO TABS: 50.0000 mg | ORAL_TABLET | Freq: Every day | ORAL | 0 refills | Status: DC

## 2017-08-25 NOTE — Assessment & Plan Note (Signed)
Check liver enzymes 

## 2017-08-25 NOTE — Progress Notes (Signed)
BP (!) 154/80 (BP Location: Left Arm, Patient Position: Sitting, Cuff Size: Large)   Pulse 68   Temp 98.5 F (36.9 C) (Oral)   Ht 5' 3.75" (1.619 m)   Wt 206 lb (93.4 kg)   SpO2 98%   BMI 35.64 kg/m    Subjective:    Patient ID: Julie Ayala, female    DOB: May 02, 1956, 62 y.o.   MRN: [id number]  HPI: Julie Ayala is a 62 y.o. female  Chief Complaint  Patient presents with  . Hypertension  . Nausea  . Fatigue    HPI Patient is here for BP check She got a 188/122 yesterday and had not yet taken her BP medicine Picked up the mdicine, took first dose around 2:30 pm and took another this morning She has not felt good for about five days; NO chest pain, NO trouble breathing Nothing salty for the last 48 hours No decongestants No black licorice, no black jelly beans Very stressed waiting on the liver enzymes, thinking about the melanoma 3 years ago Nausea, no actual vomiting, but waking her from sleep No fatigue Prediabetes; last A1c was 5.8; she is a binge eater, but otherwise says she eats well Lab Results  Component Value Date   HGBA1C 5.8 (H) 12/08/2016  Hx of melanoma 3 years ago so she has been worried about the liver  Depression screen [facility] 2/9 08/25/2017 07/19/2017 05/23/2017 04/26/2017 12/08/2016  Decreased Interest 0 0 0 0 0  Down, Depressed, Hopeless 0 0 0 0 0  PHQ - 2 Score 0 0 0 0 0    Relevant past medical, surgical, family and social history reviewed Past Medical History:  Diagnosis Date  . Allergy   . Asthma   . Asthma, mild intermittent, well-controlled 07/19/2015  . Binge eating disorder   . 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 Fatty liver 08/24/2017   [place] Jan 2019  . History of chest pain    nuclear med stress test Sep 25, 2014 negative, EF 69%  . Hyperlipidemia   . IFG (impaired fasting glucose)   . Insomnia   . OCD (obsessive compulsive disorder)   . Post-menopausal    Past Surgical History:  Procedure Laterality Date  . BREAST BIOPSY Right    core bx- neg  . CHOLECYSTECTOMY  2013  . COLONOSCOPY  06/2012   small polyps, repeat in 06/2016  . CYSTECTOMY     uterine cyst removed  . CYSTECTOMY     Hyoid cyst removed  . MELANOMA EXCISION     Family History  Problem Relation Age of Onset  . Hypertension Mother   . Heart disease Mother        pacemaker  . Congestive Heart Failure Mother        with pacemaker  . Stroke Mother        TIA/CVA  . Cancer Mother        lung  . Breast cancer Mother 32  . Cancer Father        colon and lung  . Hypertension Sister   . Mental illness Sister        anxiety  . Hypothyroidism Sister   . Heart disease Brother        unknown heart condition name, EF 55%  . Hypertension Brother   . Cancer Son        melanoma  . Diabetes Paternal Aunt   . Diabetes Paternal Uncle   . Hypertension Brother   .  Heart Problems Son        prolonged QT syndrome (syncope)  . Stroke Maternal Grandmother   . COPD Maternal Aunt   . Cirrhosis Maternal Grandfather    Social History   Tobacco Use  . Smoking status: Former Smoker    Years: 0.50    Types: Cigarettes    Last attempt to quit: 03/14/1987    Years since quitting: 30.4  . Smokeless tobacco: Never Used  Substance Use Topics  . Alcohol use: Yes    Comment: occassional  . Drug use: No    Interim medical history since last visit reviewed. Allergies and medications reviewed  Review of Systems Per HPI unless specifically indicated above     Objective:    BP (!) 154/80 (BP Location: Left Arm, Patient Position: Sitting, Cuff Size: Large)   Pulse 68   Temp 98.5 F (36.9 C) (Oral)   Ht 5' 3.75" (1.619 m)   Wt 206 lb (93.4 kg)   SpO2 98%   BMI 35.64 kg/m   Wt Readings from Last 3 Encounters:  08/25/17 206 lb (93.4 kg)  07/19/17 206 lb 14.4 oz (93.8 kg)  05/23/17 206 lb 4.8 oz (93.6 kg)    Physical Exam  Constitutional: She appears well-developed and well-nourished.  HENT:  Mouth/Throat: Mucous membranes are normal.  Eyes: EOM  are normal. No scleral icterus.  Cardiovascular: Normal rate and regular rhythm.  Pulmonary/Chest: Effort normal and breath sounds normal.  Abdominal: Soft. Bowel sounds are normal. She exhibits no distension. There is no tenderness.  Musculoskeletal: She exhibits no edema.  Neurological: She is alert. She displays no tremor.  No facial asymmetry  Skin: She is not diaphoretic. No pallor.  No flushing  Psychiatric: She has a normal mood and affect. Her behavior is normal. Her mood appears not anxious. Her affect is not blunt and not inappropriate. She does not express impulsivity. She does not exhibit a depressed mood.  Good eye contact    Results for orders placed or performed in visit on 07/19/17  Comprehensive Metabolic Panel (CMET)  Result Value Ref Range   Glucose 98 65 - 99 mg/dL   BUN 10 6 - 24 mg/dL   Creatinine, Ser 0.75 0.57 - 1.00 mg/dL   GFR calc non Af Amer 88 >59 mL/min/1.73   GFR calc Af Amer 101 >59 mL/min/1.73   BUN/Creatinine Ratio 13 9 - 23   Sodium 141 134 - 144 mmol/L   Potassium 4.4 3.5 - 5.2 mmol/L   Chloride 106 96 - 106 mmol/L   CO2 21 20 - 29 mmol/L   Calcium 9.3 8.7 - 10.2 mg/dL   Total Protein 6.7 6.0 - 8.5 g/dL   Albumin 4.5 3.5 - 5.5 g/dL   Globulin, Total 2.2 1.5 - 4.5 g/dL   Albumin/Globulin Ratio 2.0 1.2 - 2.2   Bilirubin Total 0.3 0.0 - 1.2 mg/dL   Alkaline Phosphatase 81 39 - 117 IU/L   AST 56 (H) 0 - 40 IU/L   ALT 86 (H) 0 - 32 IU/L  Specimen status report  Result Value Ref Range   specimen status report Comment   Pap IG and HPV (high risk) DNA detection  Result Value Ref Range   DIAGNOSIS: Comment    Specimen adequacy: Comment    Clinician Provided ICD10 Comment    Performed by: Comment    PAP Smear Comment .    Note: Comment    Test Methodology Comment    HPV, high-risk Negative Negative  Assessment & Plan:   Problem List Items Addressed This Visit      Cardiovascular and Mediastinum   Essential hypertension, benign     Start losartan 50 mg daily; return in 2 weeks for BP and BMP      Relevant Medications   losartan (COZAAR) 50 MG tablet   Other Relevant Orders   Basic Metabolic Panel (BMET)     Digestive   Fatty liver    Check liver enzymes      Relevant Orders   Hepatitis panel, acute     Endocrine   IFG (impaired fasting glucose)    Check A1c      Relevant Orders   Hemoglobin A1c     Musculoskeletal and Integument   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[place])    Still seeing dermatologist        Other   Hyperlipidemia    Start back on zetia      Relevant Medications   losartan (COZAAR) 50 MG tablet   Encounter for medication monitoring    Check BMP in 2 weeks      Relevant Orders   Basic Metabolic Panel (BMET)   Elevated serum glutamic pyruvic transaminase (SGPT) level    Check multiple labs, but most likely fatty liver      Relevant Orders   Comprehensive Metabolic Panel (CMET)   Hepatitis panel, acute   Hepatitis C Antibody   Hepatitis B Surface AntiBODY   Hepatitis B Surface AntiGEN   Ferritin   Ceruloplasmin   Mitochondrial/smooth muscle ab pnl   ANA,IFA RA Diag Pnl w/rflx Tit/Patn       Follow up plan: Return in about 2 weeks (around 09/08/2017) for blood pressure recheck with CMA and BMP.  An after-visit summary was printed and given to the patient at [facility].  Please see the patient instructions which may contain other information and recommendations beyond what is mentioned above in the assessment and plan.  Meds ordered this encounter  Medications  . losartan (COZAAR) 50 MG tablet    Sig: Take 1 tablet (50 mg total) by mouth daily.    Dispense:  30 tablet    Refill:  0    Orders Placed This Encounter  Procedures  . Basic Metabolic Panel (BMET)  . Comprehensive Metabolic Panel (CMET)  . Hepatitis panel, acute  . Hepatitis C Antibody  . Hepatitis B Surface AntiBODY  . Hepatitis B Surface AntiGEN  . Ferritin  . Ceruloplasmin  . Mitochondrial/smooth  muscle ab pnl  . ANA,IFA RA Diag Pnl w/rflx Tit/Patn  . Hemoglobin A1c

## 2017-08-25 NOTE — Assessment & Plan Note (Signed)
Check BMP in 2 weeks

## 2017-08-25 NOTE — Assessment & Plan Note (Signed)
Still seeing dermatologist

## 2017-08-25 NOTE — Assessment & Plan Note (Signed)
Start losartan 50 mg daily; return in 2 weeks for BP and BMP

## 2017-08-25 NOTE — Patient Instructions (Signed)
Start back on Zetia Have labs done today If you have not heard anything from my staff in a week about any orders/referrals/studies from today, please contact us here to follow-up [phone] Take a total of 50 mg of losartan today and then daily Return in 2 weeks

## 2017-08-25 NOTE — Assessment & Plan Note (Signed)
Check multiple labs, but most likely fatty liver

## 2017-08-25 NOTE — Progress Notes (Signed)
See note from earlier; high pressure She had not started her medicine BP 154/80 Start 50 mg ARB Return in 2 weeks for BP check and BMP --------------------------------------------------------- Converted to office visit

## 2017-08-25 NOTE — Assessment & Plan Note (Signed)
Check A1c. 

## 2017-08-25 NOTE — Assessment & Plan Note (Signed)
Start back on zetia

## 2017-08-26 ENCOUNTER — Other Ambulatory Visit: Payer: Self-pay | Admitting: Family Medicine

## 2017-08-26 ENCOUNTER — Telehealth: Payer: Self-pay

## 2017-08-26 DIAGNOSIS — R7989 Other specified abnormal findings of blood chemistry: Secondary | ICD-10-CM

## 2017-08-26 NOTE — Telephone Encounter (Signed)
Gastroenterology Pre-Procedure Review  Request Date: 09/09/17 Requesting Physician: Dr. Allen Norris  PATIENT REVIEW QUESTIONS: The patient responded to the following health history questions as indicated:    1. Are you having any GI issues? no 2. Do you have a personal history of Polyps? no 3. Do you have a family history of Colon Cancer or Polyps? yes (Father Colon Cancer) 4. Diabetes Mellitus? no 5. Joint replacements in the past 12 months?no 6. Major health problems in the past 3 months?no 7. Any artificial heart valves, MVP, or defibrillator?no    MEDICATIONS & ALLERGIES:    Patient reports the following regarding taking any anticoagulation/antiplatelet therapy:   Plavix, Coumadin, Eliquis, Xarelto, Lovenox, Pradaxa, Brilinta, or Effient? no Aspirin? no  Patient confirms/reports the following medications:  Current Outpatient Medications  Medication Sig Dispense Refill  . albuterol (PROVENTIL HFA;VENTOLIN HFA) 108 (90 Base) MCG/ACT inhaler Inhale 2 puffs into the lungs every 4 (four) hours as needed for wheezing or shortness of breath. 1 Inhaler 1  . budesonide-formoterol (SYMBICORT) 160-4.5 MCG/ACT inhaler Inhale 2 puffs into the lungs 2 (two) times daily. (Patient taking differently: Inhale 2 puffs into the lungs 2 (two) times daily as needed. ) 3 Inhaler 3  . ezetimibe (ZETIA) 10 MG tablet Take 1 tablet (10 mg total) by mouth daily. (Patient not taking: Reported on 07/19/2017) 90 tablet 3  . fluticasone (FLONASE) 50 MCG/ACT nasal spray Place 2 sprays into both nostrils daily. (Patient taking differently: Place 2 sprays into both nostrils daily as needed. ) 16 g 2  . losartan (COZAAR) 50 MG tablet Take 1 tablet (50 mg total) by mouth daily. (Patient not taking: Reported on 08/25/2017) 30 tablet 0  . meclizine (ANTIVERT) 12.5 MG tablet Take 1 tablet (12.5 mg total) by mouth 3 (three) times daily as needed for dizziness. 10 tablet 0   No current facility-administered medications for this  visit.     Patient confirms/reports the following allergies:  Allergies  Allergen Reactions  . Codeine Diarrhea and Nausea And Vomiting  . Latex   . Pravastatin Other (See Comments)    myalgias  . Sulfa Antibiotics Rash    No orders of the defined types were placed in this encounter.   AUTHORIZATION INFORMATION Primary Insurance: 1D#: Group #:  Secondary Insurance: 1D#: Group #:  SCHEDULE INFORMATION: Date: 09/09/17 Time: Location:[facility]

## 2017-08-26 NOTE — Progress Notes (Signed)
Check for [facility] with elevated ferritin, elev A1c, fatty liver

## 2017-08-26 NOTE — Assessment & Plan Note (Signed)
Draw labs to check for [facility]

## 2017-08-27 LAB — COMPREHENSIVE METABOLIC PANEL
ALK PHOS: 81 IU/L (ref 39–117)
ALT: 76 IU/L — AB (ref 0–32)
AST: 44 IU/L — AB (ref 0–40)
Albumin/Globulin Ratio: 2.1 (ref 1.2–2.2)
Albumin: 4.6 g/dL (ref 3.5–5.5)
BILIRUBIN TOTAL: 0.3 mg/dL (ref 0.0–1.2)
BUN/Creatinine Ratio: 16 (ref 9–23)
BUN: 15 mg/dL (ref 6–24)
CHLORIDE: 103 mmol/L (ref 96–106)
CO2: 24 mmol/L (ref 20–29)
Calcium: 9.5 mg/dL (ref 8.7–10.2)
Creatinine, Ser: 0.94 mg/dL (ref 0.57–1.00)
GFR calc Af Amer: 77 mL/min/{1.73_m2} (ref >59)
GFR calc non Af Amer: 67 mL/min/{1.73_m2} (ref >59)
GLUCOSE: 87 mg/dL (ref 65–99)
Globulin, Total: 2.2 g/dL (ref 1.5–4.5)
Potassium: 4.3 mmol/L (ref 3.5–5.2)
Sodium: 142 mmol/L (ref 134–144)
TOTAL PROTEIN: 6.8 g/dL (ref 6.0–8.5)

## 2017-08-27 LAB — HEPATITIS PANEL, ACUTE
HEP A IGM: NEGATIVE
HEP B C IGM: NEGATIVE
HEP B S AG: NEGATIVE

## 2017-08-27 LAB — CERULOPLASMIN: Ceruloplasmin: 35.7 mg/dL (ref 19.0–39.0)

## 2017-08-27 LAB — HEMOGLOBIN A1C
ESTIMATED AVERAGE GLUCOSE: 131 mg/dL
Hgb A1c MFr Bld: 6.2 % — ABNORMAL HIGH (ref 4.8–5.6)

## 2017-08-27 LAB — MITOCHONDRIAL/SMOOTH MUSCLE AB PNL
Mitochondrial Ab: 5.6 Units (ref 0.0–20.0)
Smooth Muscle Ab: 18 Units (ref 0–19)

## 2017-08-27 LAB — HEPATITIS B SURFACE ANTIBODY,QUALITATIVE: Hep B Surface Ab, Qual: REACTIVE

## 2017-08-27 LAB — FERRITIN: Ferritin: 306 ng/mL — ABNORMAL HIGH (ref 15–150)

## 2017-08-27 LAB — ANA,IFA RA DIAG PNL W/RFLX TIT/PATN
ANA Titer 1: NEGATIVE
CYCLIC CITRULLIN PEPTIDE AB: 5 U (ref 0–19)

## 2017-08-28 ENCOUNTER — Telehealth: Payer: Self-pay | Admitting: Family Medicine

## 2017-08-28 DIAGNOSIS — I10 Essential (primary) hypertension: Secondary | ICD-10-CM

## 2017-08-28 MED ORDER — LOSARTAN POTASSIUM 50 MG PO TABS: 100.0000 mg | ORAL_TABLET | Freq: Every day | ORAL | 0 refills | Status: DC

## 2017-08-28 MED ORDER — LORAZEPAM 0.5 MG PO TABS: 0.2500 mg | ORAL_TABLET | Freq: Four times a day (QID) | ORAL | 0 refills | Status: DC | PRN

## 2017-08-28 MED ORDER — AMLODIPINE BESYLATE 5 MG PO TABS: 5.0000 mg | ORAL_TABLET | Freq: Every day | ORAL | 0 refills | Status: DC

## 2017-08-28 MED ORDER — ASPIRIN EC 81 MG PO TBEC: 81.0000 mg | DELAYED_RELEASE_TABLET | Freq: Every day | ORAL | 11 refills | Status: DC

## 2017-08-28 NOTE — Telephone Encounter (Signed)
After hours nurse called me on Saturday afternoon Patient had called in, high BP; they advised her to go right then to urgent care; she refused They called me; I instructed them to tell here that I agree with her going to urgent care to get checked out; if she still refuses, have her take 100 mg losartan; check back with [place] Monday if still not controlled or go to urgent care if not controlled ------------------------------------ I called patient Sunday morning to check on her Asked about her BP 149/95, 153/98 Increased to 100 mg losartan at 1 pm or so 164/106, 140/95 at bedtime No NSAIDs No chest pain or SHOB, mild headache, no sweating Avoid salt She went more keto before Thanksgiving, red meat, stop that I advised; she stopped the day she heard about liver enzymes Energy levels are great Stress is usually okay, but this stressing her with goals for better health now with BP LMP at age 62, early she thinks Refer to nephrology for BP and secondary causes of HTN Feeling flushed in her face Pulse 60s; used to be runner, 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

## 2017-08-29 ENCOUNTER — Emergency Department: Payer: Managed Care, Other (non HMO)

## 2017-08-29 ENCOUNTER — Other Ambulatory Visit: Payer: Self-pay

## 2017-08-29 ENCOUNTER — Encounter: Payer: Self-pay | Admitting: Emergency Medicine

## 2017-08-29 ENCOUNTER — Emergency Department
Admission: EM | Admit: 2017-08-29 | Discharge: 2017-08-29 | Disposition: A | Discharge status: Home / Self Care | Payer: Managed Care, Other (non HMO) | Attending: Emergency Medicine | Admitting: Emergency Medicine

## 2017-08-29 ENCOUNTER — Ambulatory Visit: Payer: Managed Care, Other (non HMO)

## 2017-08-29 VITALS — BP 136/68

## 2017-08-29 DIAGNOSIS — R079 Chest pain, unspecified: Secondary | ICD-10-CM | POA: Diagnosis not present

## 2017-08-29 DIAGNOSIS — Z882 Allergy status to sulfonamides status: Secondary | ICD-10-CM | POA: Diagnosis not present

## 2017-08-29 DIAGNOSIS — R0789 Other chest pain: Secondary | ICD-10-CM | POA: Diagnosis present

## 2017-08-29 DIAGNOSIS — Z885 Allergy status to narcotic agent status: Secondary | ICD-10-CM | POA: Insufficient documentation

## 2017-08-29 DIAGNOSIS — Z91040 Latex allergy status: Secondary | ICD-10-CM | POA: Diagnosis not present

## 2017-08-29 DIAGNOSIS — J4520 Mild intermittent asthma, uncomplicated: Secondary | ICD-10-CM | POA: Diagnosis not present

## 2017-08-29 DIAGNOSIS — Z79899 Other long term (current) drug therapy: Secondary | ICD-10-CM | POA: Diagnosis not present

## 2017-08-29 DIAGNOSIS — Z87891 Personal history of nicotine dependence: Secondary | ICD-10-CM | POA: Diagnosis not present

## 2017-08-29 DIAGNOSIS — I10 Essential (primary) hypertension: Secondary | ICD-10-CM | POA: Insufficient documentation

## 2017-08-29 DIAGNOSIS — Z7982 Long term (current) use of aspirin: Secondary | ICD-10-CM | POA: Insufficient documentation

## 2017-08-29 LAB — BASIC METABOLIC PANEL
Anion gap: 13 (ref 5–15)
BUN: 13 mg/dL (ref 6–20)
CALCIUM: 9.7 mg/dL (ref 8.9–10.3)
CO2: 22 mmol/L (ref 22–32)
CREATININE: 0.84 mg/dL (ref 0.44–1.00)
Chloride: 102 mmol/L (ref 101–111)
GFR calc Af Amer: >60 mL/min (ref >60)
Glucose, Bld: 123 mg/dL — ABNORMAL HIGH (ref 65–99)
Potassium: 4 mmol/L (ref 3.5–5.1)
SODIUM: 137 mmol/L (ref 135–145)

## 2017-08-29 LAB — CBC
HCT: 44.8 % (ref 35.0–47.0)
Hemoglobin: 15.4 g/dL (ref 12.0–16.0)
MCH: 29.7 pg (ref 26.0–34.0)
MCHC: 34.4 g/dL (ref 32.0–36.0)
MCV: 86.1 fL (ref 80.0–100.0)
PLATELETS: 288 10*3/uL (ref 150–440)
RBC: 5.2 MIL/uL (ref 3.80–5.20)
RDW: 13.3 % (ref 11.5–14.5)
WBC: 5.9 10*3/uL (ref 3.6–11.0)

## 2017-08-29 LAB — TROPONIN I: Troponin I: <0.03 ng/mL (ref <0.03)

## 2017-08-29 MED ORDER — PANTOPRAZOLE SODIUM 40 MG PO TBEC: 40.0000 mg | DELAYED_RELEASE_TABLET | Freq: Every day | ORAL | 1 refills | Status: DC

## 2017-08-29 NOTE — ED Provider Notes (Signed)
[facility] Emergency Department Provider Note   ____________________________________________    I have reviewed the triage vital signs and the nursing notes.   HISTORY  Chief Complaint Chest Pain     HPI Julie Ayala is a 62 y.o. female who presents with complaints of burning chest discomfort x3 overnight.  Patient reports she woke up with a burning sensation in the center of her chest, initially at approximately 1 AM.  She took some baking soda and this helped her symptoms and she was able to sleep a little longer however the burning did return, again baking soda helped.  She has never had this before.  No history of heart disease.  Has been working with her physician to evaluate mild elevation in LFTs control of blood pressure recently.  Denies shortness of breath.  No pleurisy.  No calf pain or swelling.  Has not had any symptoms since prior to 5 AM  Past Medical History:  Diagnosis Date  . Allergy   . Asthma   . Asthma, mild intermittent, well-controlled 07/19/2015  . Binge eating disorder   . Clark Ayala III melanoma ([place])   . Fatty liver 08/24/2017   [place] Jan 2019  . History of chest pain    nuclear med stress test Sep 25, 2014 negative, EF 69%  . Hyperlipidemia   . IFG (impaired fasting glucose)   . Insomnia   . OCD (obsessive compulsive disorder)   . Post-menopausal     Patient Active Problem List   Diagnosis Date Noted  . Elevated ferritin 08/26/2017  . Essential hypertension, benign 08/25/2017  . Fatty liver 08/24/2017  . Elevated serum glutamic pyruvic transaminase (SGPT) Ayala 07/19/2017  . Colon cancer screening 07/19/2017  . Visit for screening mammogram 05/31/2016  . Preventative health care 05/31/2016  . Obesity 05/31/2016  . Encounter for medication monitoring 05/24/2016  . Controlled substance agreement signed 03/14/2016  . Asthma, mild intermittent, well-controlled 07/19/2015  . Hyperlipidemia   . Sciatica   . OCD  (obsessive compulsive disorder)   . Binge eating disorder   . Allergy   . IFG (impaired fasting glucose)   . Insomnia   . Clark Ayala III melanoma [facility])     Past Surgical History:  Procedure Laterality Date  . BREAST BIOPSY Right    core bx- neg  . CHOLECYSTECTOMY  2013  . COLONOSCOPY  06/2012   small polyps, repeat in 06/2016  . CYSTECTOMY     uterine cyst removed  . CYSTECTOMY     Hyoid cyst removed  . MELANOMA EXCISION      Prior to Admission medications   Medication Sig Start Date End Date Taking? Authorizing Provider  albuterol (PROVENTIL HFA;VENTOLIN HFA) 108 (90 Base) MCG/ACT inhaler Inhale 2 puffs into the lungs every 4 (four) hours as needed for wheezing or shortness of breath. 04/26/16   Arnetha Courser, MD  amLODipine (NORVASC) 5 MG tablet Take 1-2 tablets (5-10 mg total) by mouth daily. 08/28/17   Arnetha Courser, MD  aspirin EC 81 MG tablet Take 1 tablet (81 mg total) by mouth daily. 08/28/17   Arnetha Courser, MD  budesonide-formoterol (SYMBICORT) 160-4.5 MCG/ACT inhaler Inhale 2 puffs into the lungs 2 (two) times daily. Patient taking differently: Inhale 2 puffs into the lungs 2 (two) times daily as needed.  04/26/16   Arnetha Courser, MD  ezetimibe (ZETIA) 10 MG tablet Take 1 tablet (10 mg total) by mouth daily. Patient not taking: Reported  on 07/19/2017 05/23/17   Arnetha Courser, MD  fluticasone (FLONASE) 50 MCG/ACT nasal spray Place 2 sprays into both nostrils daily. Patient taking differently: Place 2 sprays into both nostrils daily as needed.  04/26/17   Hubbard Hartshorn, FNP  LORazepam (ATIVAN) 0.5 MG tablet Take 0.5-1 tablets (0.25-0.5 mg total) by mouth every 6 (six) hours as needed for anxiety. 08/28/17   Arnetha Courser, MD  losartan (COZAAR) 50 MG tablet Take 2 tablets (100 mg total) by mouth daily. 08/28/17   Arnetha Courser, MD  meclizine (ANTIVERT) 12.5 MG tablet Take 1 tablet (12.5 mg total) by mouth 3 (three) times daily as needed for dizziness. 04/26/17    Hubbard Hartshorn, FNP  pantoprazole (PROTONIX) 40 MG tablet Take 1 tablet (40 mg total) by mouth daily. 08/29/17 10/28/17  Lavonia Drafts, MD     Allergies Codeine; Latex; Pravastatin; and Sulfa antibiotics  Family History  Problem Relation Age of Onset  . Hypertension Mother   . Heart disease Mother        pacemaker  . Congestive Heart Failure Mother        with pacemaker  . Stroke Mother        TIA/CVA  . Cancer Mother        lung  . Breast cancer Mother 35  . Cancer Father        colon and lung  . Hypertension Sister   . Mental illness Sister        anxiety  . Hypothyroidism Sister   . Heart disease Brother        unknown heart condition name, EF 55%  . Hypertension Brother   . Cancer Son        melanoma  . Diabetes Paternal Aunt   . Diabetes Paternal Uncle   . Hypertension Brother   . Heart Problems Son        prolonged QT syndrome (syncope)  . Stroke Maternal Grandmother   . COPD Maternal Aunt   . Cirrhosis Maternal Grandfather     Social History Social History   Tobacco Use  . Smoking status: Former Smoker    Years: 0.50    Types: Cigarettes    Last attempt to quit: 03/14/1987    Years since quitting: 30.4  . Smokeless tobacco: Never Used  Substance Use Topics  . Alcohol use: Yes    Comment: occassional  . Drug use: No    Review of Systems  Constitutional: No fever/chills Eyes: No visual changes.  ENT: No sore throat. Cardiovascular: Above Respiratory: Denies shortness of breath. Gastrointestinal: No abdominal pain.  No nausea, no vomiting.   Genitourinary: Negative for dysuria. Musculoskeletal: Negative for back pain. Skin: Negative for rash. Neurological: Negative for headaches    ____________________________________________   PHYSICAL EXAM:  VITAL SIGNS: ED Triage Vitals  Enc Vitals Group     BP 08/29/17 0844 (!) 155/88     Pulse Rate 08/29/17 0844 66     Resp 08/29/17 0844 16     Temp 08/29/17 0844 98.6 F (37 C)     Temp Source  08/29/17 0844 Oral     SpO2 08/29/17 0844 100 %     Weight 08/29/17 0845 93.4 kg (206 lb)     Height 08/29/17 0845 1.626 m (5\' 4" )     Head Circumference --      Peak Flow --      Pain Score 08/29/17 0845 0     Pain Loc --  Pain Edu? --      Excl. in [place]? --     Constitutional: Alert and oriented. No acute distress. Pleasant and interactive Eyes: Conjunctivae are normal.   Nose: No congestion/rhinnorhea. Mouth/Throat: Mucous membranes are moist.    Cardiovascular: Normal rate, regular rhythm. Grossly normal heart sounds.  Good peripheral circulation. Respiratory: Normal respiratory effort.  No retractions. Lungs CTAB. Gastrointestinal: Soft and nontender. No distention.   Genitourinary: deferred Musculoskeletal: No lower extremity tenderness nor edema.  Warm and well perfused Neurologic:  Normal speech and language. No gross focal neurologic deficits are appreciated.  Skin:  Skin is warm, dry and intact. No rash noted. Psychiatric: Mood and affect are normal. Speech and behavior are normal.  ____________________________________________   LABS (all labs ordered are listed, but only abnormal results are displayed)  Labs Reviewed  BASIC METABOLIC PANEL - Abnormal; Notable for the following components:      Result Value   Glucose, Bld 123 (*)    All other components within normal limits  CBC  TROPONIN I   ____________________________________________  EKG  ED ECG REPORT I, Lavonia Drafts, the attending physician, personally viewed and interpreted this ECG.  Date: 08/29/2017  Rhythm: normal sinus rhythm QRS Axis: normal Intervals: normal ST/T Wave abnormalities: normal Narrative Interpretation: no evidence of acute ischemia  ____________________________________________  RADIOLOGY  Chest x-ray normal ____________________________________________   PROCEDURES  Procedure(s) performed: No  Procedures   Critical Care performed:  No ____________________________________________   INITIAL IMPRESSION / ASSESSMENT AND PLAN / ED COURSE  Pertinent labs & imaging results that were available during my care of the patient were reviewed by me and considered in my medical decision making (see chart for details).  Patient well-appearing in no acute distress.  EKG and labs are unremarkable.  She is reassured by this.  Differential includes ACS, GERD, esophagitis.  HPI does not sound consistent with ACS more likely GERD/gastritis.  Normal EKG and troponin reassuring.  Will start on PPI and have patient follow-up with cardiology for likely stress and further evaluation.  Patient agrees with this plan    ____________________________________________   FINAL CLINICAL IMPRESSION(S) / ED DIAGNOSES  Final diagnoses:  Atypical chest pain        Note:  This document was prepared using Dragon voice recognition software and may include unintentional dictation errors.    Lavonia Drafts, MD 08/29/17 1100

## 2017-08-29 NOTE — ED Triage Notes (Signed)
Arrives from PCP with c/o HTN, increased liver enzymes.  States diastolic BP have been elevated and has recently started losaarten and norvasc.  States last night she was awoken three times with c/o indigestion, took some baking soda, which helped symptoms.Julie Ayala to ED for evaluation

## 2017-08-30 ENCOUNTER — Other Ambulatory Visit: Payer: Self-pay

## 2017-08-30 ENCOUNTER — Encounter: Payer: Self-pay | Admitting: Family Medicine

## 2017-08-31 ENCOUNTER — Encounter: Payer: Self-pay | Admitting: Internal Medicine

## 2017-08-31 ENCOUNTER — Ambulatory Visit: Payer: Managed Care, Other (non HMO) | Admitting: Internal Medicine

## 2017-08-31 VITALS — BP 130/78 | HR 73 | Ht 64.0 in | Wt 209.5 lb

## 2017-08-31 DIAGNOSIS — I10 Essential (primary) hypertension: Secondary | ICD-10-CM | POA: Diagnosis not present

## 2017-08-31 DIAGNOSIS — R0789 Other chest pain: Secondary | ICD-10-CM | POA: Diagnosis not present

## 2017-08-31 NOTE — Progress Notes (Signed)
New Outpatient Visit Date: 08/31/2017  Referring Provider: Arnetha Courser, MD [id number] [place], [place] [address]  Chief Complaint: Chest pain  HPI:  Julie Ayala is a 62 y.o. female who is being seen today for the evaluation of chest pain at the request of Dr. Sanda Klein. She has a history of hypertension, hyperlipidemia, borderline diabetes, and Ayala.  She presented to the emergency department 2 days ago with a 3-day history of chest discomfort.  She describes a burning sensation in the center of her chest that improved with baking soda.  She was seen in 09/2014 by Dr. Clayborn Bigness for evaluation of chest pain and abnormal EKG.  Echocardiogram at that time was unrevealing.  She reportedly had a negative myocardial perfusion stress test shortly before that visit, though I do not see the results in Epic.  Prior myocardial perfusion stress tests in 2006 and 2011 were normal.  Julie Ayala notes problems with elevated blood pressure over the last 2 weeks. She awoke with "indigestion" 3 days ago and was referred to the ED by her PCP out of concern or acute coronary syndrome. ED workup was unrevealing with the exception of elevated blood pressure. In addition to the aforementioned substernal chest burning, Julie Ayala noted mild shortness of breath as well as dizziness. Over the last 2 weeks, Julie Ayala has also noted intermittent flushing. Julie Ayala, which typically worsens this time of year. Today, Julie Ayala feels well.  Dr. Sanda Klein has already ordered multiple tests and consultations for workup of potential secondary hypertension, including serum aldosterone/plasma renin activity, urine catecholamines/metanephrines, renal artery Doppler, and nephrology consultation, all of which are pending at this time.  --------------------------------------------------------------------------------------------------  Cardiovascular History &  Procedures: Cardiovascular Problems:  Atypical chest pain  Shortness of breath  Risk Factors:  Hypertension, hyperlipidemia, borderline diabetes mellitus, and obesity  Cath/PCI:  None  CV Surgery:  None  EP Procedures and Devices:  None  Non-Invasive Evaluation(s):  Echocardiogram (10/01/14): Normal LVEF. Mild TR/PR. Normal RV size and function.  Myocardial perfusion stress test (09/2014): Formal report not available. Discharge summary notes study "within normal limits" with LVEF 69%.  Exercise myocardial perfusion stress test (06/25/10): Normal study without ischemia or scar. LVEF 80%. Hypertensive blood pressure response.  Exercise myocardial perfusion stress test (02/18/05): Low risk study with fixed inferior defect likely representing attenuation artifact.  No ischemia.  LVEF 58%.  Recent CV Pertinent Labs: Lab Results  Component Value Date   CHOL 250 (H) 12/08/2016   HDL 47 12/08/2016   LDLCALC 158 (H) 12/08/2016   TRIG 223 (H) 12/08/2016   CHOLHDL 5.3 (H) 12/08/2016   K 4.0 08/29/2017   BUN 13 08/29/2017   BUN 15 08/25/2017   CREATININE 0.84 08/29/2017    --------------------------------------------------------------------------------------------------  Past Medical History:  Diagnosis Date  . Allergy   . Ayala   . Ayala, mild intermittent, well-controlled 07/19/2015  . Binge eating disorder   . 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 Fatty liver 08/24/2017   [place] Jan 2019  . History of chest pain    nuclear med stress test Sep 25, 2014 negative, EF 69%  . Hyperlipidemia   . IFG (impaired fasting glucose)   . Insomnia   . OCD (obsessive compulsive disorder)   . Post-menopausal     Past Surgical History:  Procedure Laterality Date  . BREAST BIOPSY Right    core bx- neg  .  CHOLECYSTECTOMY  2013  . COLONOSCOPY  06/2012   small polyps, repeat in 06/2016  . CYSTECTOMY     uterine cyst removed  . CYSTECTOMY     Hyoid cyst removed  . MELANOMA EXCISION       Current Meds  Medication Sig  . albuterol (PROVENTIL HFA;VENTOLIN HFA) 108 (90 Base) MCG/ACT inhaler Inhale 2 puffs into the lungs every 4 (four) hours as needed for wheezing or shortness of breath.  Marland Kitchen amLODipine (NORVASC) 5 MG tablet Take 1-2 tablets (5-10 mg total) by mouth daily.  Marland Kitchen aspirin EC 81 MG tablet Take 1 tablet (81 mg total) by mouth daily.  . budesonide-formoterol (SYMBICORT) 160-4.5 MCG/ACT inhaler Inhale 2 puffs into the lungs 2 (two) times daily. (Patient taking differently: Inhale 2 puffs into the lungs 2 (two) times daily as needed. )  . ezetimibe (ZETIA) 10 MG tablet Take 1 tablet (10 mg total) by mouth daily.  . fluticasone (FLONASE) 50 MCG/ACT nasal spray Place 2 sprays into both nostrils daily. (Patient taking differently: Place 2 sprays into both nostrils daily as needed. )  . LORazepam (ATIVAN) 0.5 MG tablet Take 0.5-1 tablets (0.25-0.5 mg total) by mouth every 6 (six) hours as needed for anxiety.  Marland Kitchen losartan (COZAAR) 50 MG tablet Take 2 tablets (100 mg total) by mouth daily.  . pantoprazole (PROTONIX) 40 MG tablet Take 1 tablet (40 mg total) by mouth daily.    Allergies: Codeine; Latex; Pravastatin; and Sulfa antibiotics  Social History   Socioeconomic History  . Marital status: Divorced    Spouse name: Not on file  . Number of children: Not on file  . Years of education: Not on file  . Highest education level: Not on file  Social Needs  . Financial resource strain: Not on file  . Food insecurity - worry: Not on file  . Food insecurity - inability: Not on file  . Transportation needs - medical: Not on file  . Transportation needs - non-medical: Not on file  Occupational History  . Not on file  Tobacco Use  . Smoking status: Former Smoker    Years: 0.50    Types: Cigarettes    Last attempt to quit: 03/14/1987    Years since quitting: 30.4  . Smokeless tobacco: Never Used  Substance and Sexual Activity  . Alcohol use: Yes    Comment: occassional    . Drug use: No  . Sexual activity: Not Currently  Other Topics Concern  . Not on file  Social History Narrative  . Not on file    Family History  Problem Relation Age of Onset  . Hypertension Mother   . Heart disease Mother        pacemaker  . Congestive Heart Failure Mother        with pacemaker  . Stroke Mother        TIA/CVA  . Cancer Mother        lung  . Breast cancer Mother 15  . Cancer Father        colon and lung  . Hypertension Sister   . Mental illness Sister        anxiety  . Hypothyroidism Sister   . Heart disease Brother        unknown heart condition name, EF 55%  . Hypertension Brother   . Cancer Son        melanoma  . Diabetes Paternal Aunt   . Diabetes Paternal Uncle   . Hypertension Brother   .  Heart Problems Son        prolonged QT syndrome (syncope)  . Stroke Maternal Grandmother   . COPD Maternal Aunt   . Cirrhosis Maternal Grandfather     Review of Systems: A 12-system review of systems was performed and was negative except as noted in the HPI.  --------------------------------------------------------------------------------------------------  Physical Exam: BP 130/78 (BP Location: Right Arm, Patient Position: Sitting, Cuff Size: Normal)   Pulse 73   Ht 5\' 4"  (1.626 m)   Wt 209 lb 8 oz (95 kg)   BMI 35.96 kg/m   General:  Obese woman, seated comfortably in the exam room. HEENT: No conjunctival pallor or scleral icterus. Moist mucous membranes. OP clear. Neck: Supple without lymphadenopathy, thyromegaly, JVD, or HJR. No carotid bruit. Lungs: Normal work of breathing. Clear to auscultation bilaterally without wheezes or crackles. Heart: Regular rate and rhythm without murmurs, rubs, or gallops. Unable to assess PMI due to body habitus. Abd: Bowel sounds present. Soft, NT/ND. Unable to assess HSM due to body habitus. Ext: No lower extremity edema. Radial, PT, and DP pulses are 2+ bilaterally Skin: Warm and dry without rash. Neuro:  CNIII-XII intact. Strength and fine-touch sensation intact in upper and lower extremities bilaterally. Psych: Normal mood and affect.  EKG:  NSR, left axis deviation, low voltage, non-specific T-wave changes, and poor R-wave progression. No significant change since 08/29/17.  Lab Results  Component Value Date   WBC 5.9 08/29/2017   HGB 15.4 08/29/2017   HCT 44.8 08/29/2017   MCV 86.1 08/29/2017   PLT 288 08/29/2017    Lab Results  Component Value Date   NA 137 08/29/2017   K 4.0 08/29/2017   CL 102 08/29/2017   CO2 22 08/29/2017   BUN 13 08/29/2017   CREATININE 0.84 08/29/2017   GLUCOSE 123 (H) 08/29/2017   ALT 76 (H) 08/25/2017    Lab Results  Component Value Date   CHOL 250 (H) 12/08/2016   HDL 47 12/08/2016   LDLCALC 158 (H) 12/08/2016   TRIG 223 (H) 12/08/2016   CHOLHDL 5.3 (H) 12/08/2016     --------------------------------------------------------------------------------------------------  ASSESSMENT AND PLAN: Atypical chest pain Recent episode of chest pain was atypical and has not recurred since ED visit. EKG today shows non-specific T wave changes and poor R wave progression, which are unchanged. Given accompanying flushing and labile blood pressure, I agree with workup for secondary causes of hypertension, which could be contributing to Julie Ayala's symptoms. Given multiple negative stress tests over the last 10-15 years (most recently in 09/2014), we have agreed to defer additional ischemia testing for now. Empiric treatment for GERD should be continued.  Hypertension Blood pressure borderline elevated today. I will defer ongoing workup for secondary hypertension to Dr. Sanda Klein and upcoming nephrology consultation.  Follow-up: Return to clinic in 3 months.  Nelva Bush, MD 08/31/2017 2:51 PM

## 2017-08-31 NOTE — Patient Instructions (Signed)
Medication Instructions:  Your physician recommends that you continue on your current medications as directed. Please refer to the Current Medication list given to you today.   Labwork: none  Testing/Procedures: none  Follow-Up: Your physician recommends that you schedule a follow-up appointment in: 3 MONTHS WITH DR END.   If you need a refill on your cardiac medications before your next appointment, please call your pharmacy.   

## 2017-09-01 ENCOUNTER — Other Ambulatory Visit (INDEPENDENT_AMBULATORY_CARE_PROVIDER_SITE_OTHER): Payer: Managed Care, Other (non HMO) | Admitting: *Deleted

## 2017-09-01 ENCOUNTER — Telehealth: Payer: Self-pay | Admitting: Family Medicine

## 2017-09-01 ENCOUNTER — Ambulatory Visit
Admission: RE | Admit: 2017-09-01 | Discharge: 2017-09-01 | Disposition: A | Discharge status: Home / Self Care | Payer: Managed Care, Other (non HMO) | Source: Ambulatory Visit | Attending: Family Medicine | Admitting: Family Medicine

## 2017-09-01 ENCOUNTER — Encounter: Payer: Self-pay | Admitting: Internal Medicine

## 2017-09-01 DIAGNOSIS — R0789 Other chest pain: Secondary | ICD-10-CM | POA: Diagnosis not present

## 2017-09-01 DIAGNOSIS — I10 Essential (primary) hypertension: Secondary | ICD-10-CM | POA: Insufficient documentation

## 2017-09-01 NOTE — Telephone Encounter (Signed)
Patient came tot he window with FMLA papers and I talked with her; she feels like she can return to work Monday; saw cardiologist yesterday, renal US today; I'll see her Monday morning, get labs (BMP), check BP and write return to work note Filling out FMLA paperwork now

## 2017-09-02 ENCOUNTER — Ambulatory Visit: Payer: Managed Care, Other (non HMO)

## 2017-09-05 ENCOUNTER — Encounter: Payer: Self-pay | Admitting: Family Medicine

## 2017-09-05 ENCOUNTER — Ambulatory Visit: Payer: Managed Care, Other (non HMO) | Admitting: Family Medicine

## 2017-09-05 VITALS — BP 126/78 | HR 76 | Temp 98.6°F | Resp 14 | Wt 208.9 lb

## 2017-09-05 DIAGNOSIS — Z6835 Body mass index (BMI) 35.0-35.9, adult: Secondary | ICD-10-CM

## 2017-09-05 DIAGNOSIS — K760 Fatty (change of) liver, not elsewhere classified: Secondary | ICD-10-CM | POA: Diagnosis not present

## 2017-09-05 DIAGNOSIS — I10 Essential (primary) hypertension: Secondary | ICD-10-CM

## 2017-09-05 DIAGNOSIS — E6609 Other obesity due to excess calories: Secondary | ICD-10-CM

## 2017-09-05 DIAGNOSIS — Z5181 Encounter for therapeutic drug level monitoring: Secondary | ICD-10-CM | POA: Diagnosis not present

## 2017-09-05 DIAGNOSIS — R7989 Other specified abnormal findings of blood chemistry: Secondary | ICD-10-CM | POA: Diagnosis not present

## 2017-09-05 MED ORDER — AMLODIPINE BESYLATE 10 MG PO TABS: 10.0000 mg | ORAL_TABLET | Freq: Every day | ORAL | 3 refills | Status: DC

## 2017-09-05 MED ORDER — EZETIMIBE 10 MG PO TABS: 10.0000 mg | ORAL_TABLET | Freq: Every day | ORAL | 3 refills | Status: DC

## 2017-09-05 MED ORDER — LOSARTAN POTASSIUM 100 MG PO TABS: 100.0000 mg | ORAL_TABLET | Freq: Every day | ORAL | 3 refills | Status: DC

## 2017-09-05 NOTE — Assessment & Plan Note (Signed)
Check for [facility]

## 2017-09-05 NOTE — Patient Instructions (Addendum)
Stay out of work the rest of this week Do the 24 hour urine collection I've put in a referral for you to see a bariatric specialist Call me Friday afternoon and we can talk about plans for work next Monday

## 2017-09-05 NOTE — Progress Notes (Signed)
BP 126/78   Pulse 76   Temp 98.6 F (37 C) (Oral)   Resp 14   Wt 208 lb 14.4 oz (94.8 kg)   SpO2 96%   BMI 35.86 kg/m    Subjective:    Patient ID: Julie Ayala, female    DOB: 12/27/1955, 62 y.o.   MRN: [id number]  HPI: Julie Ayala is a 62 y.o. female  Chief Complaint  Patient presents with  . Hypertension    follow-up  . Panic Attack  . Diarrhea    HPI Patient is here for f/u She had a good restful weekend She did have some panic attacks, though Started on Friday and anything would set her off; not wanting the phone to ring or a text message; is someone in the driveway; expecting something, a little jumpy; panting at times, through her mouht, hyperventilating, tingling in both fingers, hears heart beat; had panic attacks back in her twenties going through a divorce with real trigger and stress Used the ativan for panic and that worked well BP is under better control Her BPs have been 148/94, always higher diastolic getting out of bed Most days taking 10 mg amlo Taking 100 mg losartan She does not think she snores or has sleep apnea; she recorded herself sleeping and didn't pick up anything; [place] and they made fun of those who did snore, never her; feels refreshed in the morning Avoiding decongestants, salt, black licorice Colonoscopy on Friday, it was due in November Had the elevated LFTs and did drink more eggnog over the holidays and was doing keto diet, so interested in what her liver enzymes are doing now She goes to see nephrologist Feb 4th  Depression screen [facility] 2/9 08/25/2017 07/19/2017 05/23/2017 04/26/2017 12/08/2016  Decreased Interest 0 0 0 0 0  Down, Depressed, Hopeless 0 0 0 0 0  PHQ - 2 Score 0 0 0 0 0    Relevant past medical, surgical, family and social history reviewed Past Medical History:  Diagnosis Date  . Allergy   . Asthma   . Asthma, mild intermittent, well-controlled 07/19/2015  . Binge eating disorder    . 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 Fatty liver 08/24/2017   [place] Jan 2019  . History of chest pain    nuclear med stress test Sep 25, 2014 negative, EF 69%  . Hyperlipidemia   . Hypertension   . IFG (impaired fasting glucose)   . Insomnia   . Motion sickness    all moving things  . OCD (obsessive compulsive disorder)   . PONV (postoperative nausea and vomiting)   . Post-menopausal   . Wears contact lenses    Past Surgical History:  Procedure Laterality Date  . BREAST BIOPSY Right    core bx- neg  . CHOLECYSTECTOMY  2013  . COLONOSCOPY  06/2012   small polyps, repeat in 06/2016  . COLONOSCOPY WITH PROPOFOL N/A 09/09/2017   Procedure: COLONOSCOPY WITH PROPOFOL;  Surgeon: Lucilla Lame, MD;  Location: [facility];  Service: Endoscopy;  Laterality: N/A;  . CYSTECTOMY     uterine cyst removed  . CYSTECTOMY     Hyoid cyst removed  . MELANOMA EXCISION    . POLYPECTOMY  09/09/2017   Procedure: POLYPECTOMY;  Surgeon: Lucilla Lame, MD;  Location: [facility];  Service: Endoscopy;;   Family History  Problem Relation Age of Onset  . Hypertension Mother   . Heart disease Mother  pacemaker  . Congestive Heart Failure Mother        with pacemaker  . Stroke Mother        TIA/CVA  . Cancer Mother        lung  . Breast cancer Mother 4  . Cancer Father        colon and lung  . Hypertension Sister   . Mental illness Sister        anxiety  . Hypothyroidism Sister   . Heart disease Brother        unknown heart condition name, EF 55%  . Hypertension Brother   . Cancer Son        melanoma  . Diabetes Paternal Aunt   . Diabetes Paternal Uncle   . Hypertension Brother   . Heart Problems Son        prolonged QT syndrome (syncope)  . Stroke Maternal Grandmother   . COPD Maternal Aunt   . Cirrhosis Maternal Grandfather    Social History   Tobacco Use  . Smoking status: Former Smoker    Years: 0.50    Types: Cigarettes    Last attempt to quit: 03/14/1987      Years since quitting: 30.5  . Smokeless tobacco: Never Used  Substance Use Topics  . Alcohol use: No    Frequency: Never  . Drug use: No    Interim medical history since last visit reviewed. Allergies and medications reviewed  Review of Systems Per HPI unless specifically indicated above     Objective:    BP 126/78   Pulse 76   Temp 98.6 F (37 C) (Oral)   Resp 14   Wt 208 lb 14.4 oz (94.8 kg)   SpO2 96%   BMI 35.86 kg/m   Wt Readings from Last 3 Encounters:  09/09/17 205 lb (93 kg)  09/05/17 208 lb 14.4 oz (94.8 kg)  08/31/17 209 lb 8 oz (95 kg)    Physical Exam  Constitutional: She appears well-developed and well-nourished.  HENT:  Head: Normocephalic and atraumatic.  Eyes: EOM are normal. Right eye exhibits no hordeolum. Left eye exhibits no hordeolum. No scleral icterus.  Cardiovascular: Normal rate, regular rhythm, S1 normal and S2 normal.  No extrasystoles are present.  Pulmonary/Chest: Effort normal and breath sounds normal. Right breast exhibits no inverted nipple, no mass, no nipple discharge, no skin change and no tenderness. Left breast exhibits no inverted nipple, no mass, no nipple discharge, no skin change and no tenderness. Breasts are symmetrical.  Abdominal: Soft. Normal appearance. She exhibits no distension, no abdominal bruit and no pulsatile midline mass. There is no hepatosplenomegaly. No hernia.  Musculoskeletal: Normal range of motion. She exhibits no edema.  Neurological: She is alert. She displays no tremor. Gait normal.  Skin: Skin is warm and dry. No bruising and no ecchymosis noted. No pallor.  Psychiatric: Her speech is normal and behavior is normal. Thought content normal. Her mood appears not anxious. She does not exhibit a depressed mood.    Results for orders placed or performed during the hospital encounter of 26/83/41  Basic metabolic panel  Result Value Ref Range   Sodium 137 135 - 145 mmol/L   Potassium 4.0 3.5 - 5.1 mmol/L    Chloride 102 101 - 111 mmol/L   CO2 22 22 - 32 mmol/L   Glucose, Bld 123 (H) 65 - 99 mg/dL   BUN 13 6 - 20 mg/dL   Creatinine, Ser 0.84 0.44 - 1.00 mg/dL  Calcium 9.7 8.9 - 10.3 mg/dL   GFR calc non Af Amer >60 >60 mL/min   GFR calc Af Amer >60 >60 mL/min   Anion gap 13 5 - 15  CBC  Result Value Ref Range   WBC 5.9 3.6 - 11.0 K/uL   RBC 5.20 3.80 - 5.20 MIL/uL   Hemoglobin 15.4 12.0 - 16.0 g/dL   HCT 44.8 35.0 - 47.0 %   MCV 86.1 80.0 - 100.0 fL   MCH 29.7 26.0 - 34.0 pg   MCHC 34.4 32.0 - 36.0 g/dL   RDW 13.3 11.5 - 14.5 %   Platelets 288 150 - 440 K/uL  Troponin I  Result Value Ref Range   Troponin I <0.03 <0.03 ng/mL      Assessment & Plan:   Problem List Items Addressed This Visit      Cardiovascular and Mediastinum   Essential hypertension, benign    Consider sleep apnea as cause of elevated pressures recently; recommended getting a sleep study; she politely declined, even after explaining risk of uncontrolled HTN; nephro appt upcoming; continue the search for reversible or treatable HTN      Relevant Medications   amLODipine (NORVASC) 10 MG tablet   losartan (COZAAR) 100 MG tablet   ezetimibe (ZETIA) 10 MG tablet     Digestive   Fatty liver - Primary   Relevant Orders   Comprehensive metabolic panel   Comprehensive metabolic panel     Other   Encounter for medication monitoring   Relevant Orders   Comprehensive metabolic panel   Comprehensive metabolic panel   Obesity    Refer to bariatric surgery      Relevant Orders   Amb Referral to Bariatric Surgery   Elevated ferritin    Check for HH          Follow up plan: No Follow-up on file.  An after-visit summary was printed and given to the patient at [place].  Please see the patient instructions which may contain other information and recommendations beyond what is mentioned above in the assessment and plan.  Meds ordered this encounter  Medications  . amLODipine (NORVASC) 10 MG tablet     Sig: Take 1 tablet (10 mg total) by mouth daily.    Dispense:  90 tablet    Refill:  3  . losartan (COZAAR) 100 MG tablet    Sig: Take 1 tablet (100 mg total) by mouth daily.    Dispense:  90 tablet    Refill:  3  . ezetimibe (ZETIA) 10 MG tablet    Sig: Take 1 tablet (10 mg total) by mouth daily.    Dispense:  90 tablet    Refill:  3    Orders Placed This Encounter  Procedures  . Comprehensive metabolic panel  . Comprehensive metabolic panel  . Amb Referral to Bariatric Surgery

## 2017-09-05 NOTE — Assessment & Plan Note (Addendum)
Consider sleep apnea as cause of elevated pressures recently; recommended getting a sleep study; she politely declined, even after explaining risk of uncontrolled HTN; nephro appt upcoming; continue the search for reversible or treatable HTN

## 2017-09-05 NOTE — Assessment & Plan Note (Signed)
Refer to bariatric surgery  

## 2017-09-08 ENCOUNTER — Other Ambulatory Visit: Payer: Self-pay

## 2017-09-08 ENCOUNTER — Encounter: Payer: Self-pay | Admitting: Family Medicine

## 2017-09-08 ENCOUNTER — Encounter: Payer: Self-pay | Admitting: *Deleted

## 2017-09-08 ENCOUNTER — Ambulatory Visit: Payer: Managed Care, Other (non HMO)

## 2017-09-08 DIAGNOSIS — Z1211 Encounter for screening for malignant neoplasm of colon: Secondary | ICD-10-CM

## 2017-09-08 NOTE — Discharge Instructions (Signed)
General Anesthesia, Adult, Care After °These instructions provide you with information about caring for yourself after your procedure. Your health care provider may also give you more specific instructions. Your treatment has been planned according to current medical practices, but problems sometimes occur. Call your health care provider if you have any problems or questions after your procedure. °What can I expect after the procedure? °After the procedure, it is common to have: °· Vomiting. °· A sore throat. °· Mental slowness. ° °It is common to feel: °· Nauseous. °· Cold or shivery. °· Sleepy. °· Tired. °· Sore or achy, even in parts of your body where you did not have surgery. ° °Follow these instructions at home: °For at least 24 hours after the procedure: °· Do not: °? Participate in activities where you could fall or become injured. °? Drive. °? Use heavy machinery. °? Drink alcohol. °? Take sleeping pills or medicines that cause drowsiness. °? Make important decisions or sign legal documents. °? Take care of children on your own. °· Rest. °Eating and drinking °· If you vomit, drink water, juice, or soup when you can drink without vomiting. °· Drink enough fluid to keep your urine clear or pale yellow. °· Make sure you have little or no nausea before eating solid foods. °· Follow the diet recommended by your health care provider. °General instructions °· Have a responsible adult stay with you until you are awake and alert. °· Return to your normal activities as told by your health care provider. Ask your health care provider what activities are safe for you. °· Take over-the-counter and prescription medicines only as told by your health care provider. °· If you smoke, do not smoke without supervision. °· Keep all follow-up visits as told by your health care provider. This is important. °Contact a health care provider if: °· You continue to have nausea or vomiting at home, and medicines are not helpful. °· You  cannot drink fluids or start eating again. °· You cannot urinate after 8-12 hours. °· You develop a skin rash. °· You have fever. °· You have increasing redness at the site of your procedure. °Get help right away if: °· You have difficulty breathing. °· You have chest pain. °· You have unexpected bleeding. °· You feel that you are having a life-threatening or urgent problem. °This information is not intended to replace advice given to you by your health care provider. Make sure you discuss any questions you have with your health care provider. °Document Released: 11/08/2000 Document Revised: 01/05/2016 Document Reviewed: 07/17/2015 °Elsevier Interactive Patient Education © 2018 [employer] ° °

## 2017-09-09 ENCOUNTER — Ambulatory Visit: Payer: Managed Care, Other (non HMO) | Admitting: Anesthesiology

## 2017-09-09 ENCOUNTER — Ambulatory Visit
Admission: RE | Admit: 2017-09-09 | Discharge: 2017-09-09 | Disposition: A | Discharge status: Home / Self Care | Payer: Managed Care, Other (non HMO) | Source: Ambulatory Visit | Attending: Gastroenterology | Admitting: Gastroenterology

## 2017-09-09 ENCOUNTER — Encounter
Admission: RE | Disposition: A | Discharge status: Home / Self Care | Payer: Self-pay | Source: Ambulatory Visit | Attending: Gastroenterology

## 2017-09-09 ENCOUNTER — Encounter: Payer: Self-pay | Admitting: Family Medicine

## 2017-09-09 DIAGNOSIS — Z860100 Personal history of colon polyps, unspecified: Secondary | ICD-10-CM

## 2017-09-09 DIAGNOSIS — Z1211 Encounter for screening for malignant neoplasm of colon: Secondary | ICD-10-CM | POA: Insufficient documentation

## 2017-09-09 DIAGNOSIS — I10 Essential (primary) hypertension: Secondary | ICD-10-CM | POA: Diagnosis not present

## 2017-09-09 DIAGNOSIS — E785 Hyperlipidemia, unspecified: Secondary | ICD-10-CM | POA: Insufficient documentation

## 2017-09-09 DIAGNOSIS — J45909 Unspecified asthma, uncomplicated: Secondary | ICD-10-CM | POA: Insufficient documentation

## 2017-09-09 DIAGNOSIS — G4700 Insomnia, unspecified: Secondary | ICD-10-CM | POA: Insufficient documentation

## 2017-09-09 DIAGNOSIS — Z85820 Personal history of malignant melanoma of skin: Secondary | ICD-10-CM | POA: Diagnosis not present

## 2017-09-09 DIAGNOSIS — F429 Obsessive-compulsive disorder, unspecified: Secondary | ICD-10-CM | POA: Diagnosis not present

## 2017-09-09 DIAGNOSIS — K635 Polyp of colon: Secondary | ICD-10-CM

## 2017-09-09 DIAGNOSIS — Z79899 Other long term (current) drug therapy: Secondary | ICD-10-CM | POA: Diagnosis not present

## 2017-09-09 DIAGNOSIS — D125 Benign neoplasm of sigmoid colon: Secondary | ICD-10-CM | POA: Diagnosis not present

## 2017-09-09 DIAGNOSIS — K640 First degree hemorrhoids: Secondary | ICD-10-CM | POA: Diagnosis not present

## 2017-09-09 DIAGNOSIS — Z87891 Personal history of nicotine dependence: Secondary | ICD-10-CM | POA: Diagnosis not present

## 2017-09-09 HISTORY — DX: Essential (primary) hypertension: I10

## 2017-09-09 HISTORY — DX: Motion sickness, initial encounter: T75.3XXA

## 2017-09-09 HISTORY — DX: Other specified postprocedural states: Z98.890

## 2017-09-09 HISTORY — DX: Presence of spectacles and contact lenses: Z97.3

## 2017-09-09 HISTORY — PX: COLONOSCOPY WITH PROPOFOL: SHX5780

## 2017-09-09 HISTORY — PX: POLYPECTOMY: SHX5525

## 2017-09-09 HISTORY — DX: Other specified postprocedural states: R11.2

## 2017-09-09 LAB — COMPREHENSIVE METABOLIC PANEL
ALBUMIN: 4.8 g/dL (ref 3.5–5.5)
ALK PHOS: 90 IU/L (ref 39–117)
ALT: 81 IU/L — AB (ref 0–32)
AST: 44 IU/L — ABNORMAL HIGH (ref 0–40)
Albumin/Globulin Ratio: 1.9 (ref 1.2–2.2)
BILIRUBIN TOTAL: 0.4 mg/dL (ref 0.0–1.2)
BUN / CREAT RATIO: 15 (ref 9–23)
BUN: 11 mg/dL (ref 6–24)
CHLORIDE: 102 mmol/L (ref 96–106)
CO2: 22 mmol/L (ref 20–29)
CREATININE: 0.75 mg/dL (ref 0.57–1.00)
Calcium: 9.8 mg/dL (ref 8.7–10.2)
GFR calc non Af Amer: 88 mL/min/{1.73_m2} (ref >59)
GFR, EST AFRICAN AMERICAN: 101 mL/min/{1.73_m2} (ref >59)
GLUCOSE: 100 mg/dL — AB (ref 65–99)
Globulin, Total: 2.5 g/dL (ref 1.5–4.5)
Potassium: 4.1 mmol/L (ref 3.5–5.2)
Sodium: 142 mmol/L (ref 134–144)
TOTAL PROTEIN: 7.3 g/dL (ref 6.0–8.5)

## 2017-09-09 LAB — ALDOSTERONE + RENIN ACTIVITY W/ RATIO
ALDOS/RENIN RATIO: 0.6 (ref 0.0–30.0)
ALDOSTERONE: 2.1 ng/dL (ref 0.0–30.0)
RENIN: 3.681 ng/mL/h (ref 0.167–5.380)

## 2017-09-09 LAB — TSH+FREE T4
Free T4: 1 ng/dL (ref 0.82–1.77)
TSH: 1.73 u[IU]/mL (ref 0.450–4.500)

## 2017-09-09 LAB — HEMOCHROMATOSIS DNA-PCR(C282Y,H63D)

## 2017-09-09 SURGERY — COLONOSCOPY WITH PROPOFOL
Anesthesia: General | Site: Rectum | Wound class: Contaminated

## 2017-09-09 MED ORDER — ACETAMINOPHEN 325 MG PO TABS: 650.0000 mg | ORAL_TABLET | Freq: Once | ORAL | Status: DC | PRN

## 2017-09-09 MED ORDER — ACETAMINOPHEN 160 MG/5ML PO SOLN: 325.0000 mg | ORAL | Status: DC | PRN

## 2017-09-09 MED ORDER — LACTATED RINGERS IV SOLN
INTRAVENOUS | Status: DC
  Administered 2017-09-09 (×2): via INTRAVENOUS

## 2017-09-09 MED ORDER — LIDOCAINE HCL (CARDIAC) 20 MG/ML IV SOLN
INTRAVENOUS | Status: DC | PRN
  Administered 2017-09-09: 30 mg via INTRAVENOUS

## 2017-09-09 MED ORDER — ONDANSETRON HCL 4 MG/2ML IJ SOLN: 4.0000 mg | Freq: Once | INTRAMUSCULAR | Status: DC | PRN

## 2017-09-09 MED ORDER — STERILE WATER FOR IRRIGATION IR SOLN
Status: DC | PRN
  Administered 2017-09-09: 08:00:00

## 2017-09-09 MED ORDER — SODIUM CHLORIDE 0.9 % IV SOLN: INTRAVENOUS | Status: DC

## 2017-09-09 MED ORDER — PROPOFOL 10 MG/ML IV BOLUS
INTRAVENOUS | Status: DC | PRN
  Administered 2017-09-09: 80 mg via INTRAVENOUS
  Administered 2017-09-09 (×2): 40 mg via INTRAVENOUS
  Administered 2017-09-09: 20 mg via INTRAVENOUS
  Administered 2017-09-09: 40 mg via INTRAVENOUS

## 2017-09-09 SURGICAL SUPPLY — 24 items
CANISTER SUCT 1200ML W/VALVE (MISCELLANEOUS) ×4 IMPLANT
CLIP HMST 235XBRD CATH ROT (MISCELLANEOUS) IMPLANT
CLIP RESOLUTION 360 11X235 (MISCELLANEOUS)
ELECT REM PT RETURN 9FT ADLT (ELECTROSURGICAL)
ELECTRODE REM PT RTRN 9FT ADLT (ELECTROSURGICAL) IMPLANT
FCP ESCP3.2XJMB 240X2.8X (MISCELLANEOUS)
FORCEPS BIOP RAD 4 LRG CAP 4 (CUTTING FORCEPS) IMPLANT
FORCEPS BIOP RJ4 240 W/NDL (MISCELLANEOUS)
FORCEPS ESCP3.2XJMB 240X2.8X (MISCELLANEOUS) IMPLANT
GOWN CVR UNV OPN BCK APRN NK (MISCELLANEOUS) ×4 IMPLANT
GOWN ISOL THUMB LOOP REG UNIV (MISCELLANEOUS) ×4
INJECTOR VARIJECT VIN23 (MISCELLANEOUS) IMPLANT
KIT DEFENDO VALVE AND CONN (KITS) IMPLANT
KIT ENDO PROCEDURE OLY (KITS) ×4 IMPLANT
MARKER SPOT ENDO TATTOO 5ML (MISCELLANEOUS) IMPLANT
PROBE APC STR FIRE (PROBE) IMPLANT
RETRIEVER NET ROTH 2.5X230 LF (MISCELLANEOUS) IMPLANT
SNARE SHORT THROW 13M SML OVAL (MISCELLANEOUS) IMPLANT
SNARE SHORT THROW 30M LRG OVAL (MISCELLANEOUS) IMPLANT
SNARE SNG USE RND 15MM (INSTRUMENTS) IMPLANT
SPOT EX ENDOSCOPIC TATTOO (MISCELLANEOUS)
TRAP ETRAP POLY (MISCELLANEOUS) IMPLANT
VARIJECT INJECTOR VIN23 (MISCELLANEOUS)
WATER STERILE IRR 250ML POUR (IV SOLUTION) ×4 IMPLANT

## 2017-09-09 NOTE — Op Note (Signed)
[facility] Gastroenterology Patient Name: Julie Ayala Procedure Date: 09/09/2017 7:20 AM MRN: [id number] Account #: [id number] Date of Birth: 09-06-1955 Admit Type: Outpatient Age: 62 Room: [facility] OR ROOM 01 Gender: Female Note Status: Finalized Procedure:            Colonoscopy Indications:          High risk colon cancer surveillance: Personal history                        of colonic polyps Providers:            Lucilla Lame MD, MD Referring MD:         Arnetha Courser (Referring MD) Medicines:            Propofol per Anesthesia Complications:        No immediate complications. Procedure:            Pre-Anesthesia Assessment:                       - Prior to the procedure, a History and Physical was                        performed, and patient medications and allergies were                        reviewed. The patient's tolerance of previous                        anesthesia was also reviewed. The risks and benefits of                        the procedure and the sedation options and risks were                        discussed with the patient. All questions were                        answered, and informed consent was obtained. Prior                        Anticoagulants: The patient has taken no previous                        anticoagulant or antiplatelet agents. ASA Grade                        Assessment: II - A patient with mild systemic disease.                        After reviewing the risks and benefits, the patient was                        deemed in satisfactory condition to undergo the                        procedure.                       After obtaining informed consent, the colonoscope was  passed under direct vision. Throughout the procedure,                        the patient's blood pressure, pulse, and oxygen                        saturations were monitored continuously. The [place] [id number]) was introduced through the                        anus and advanced to the the cecum, identified by                        appendiceal orifice and ileocecal valve. The                        colonoscopy was performed with ease. The patient                        tolerated the procedure well. The quality of the bowel                        preparation was excellent. Findings:      The perianal and digital rectal examinations were normal.      A 2 mm polyp was found in the sigmoid colon. The polyp was sessile. The       polyp was removed with a cold biopsy forceps. Resection and retrieval       were complete.      Non-bleeding internal hemorrhoids were found during retroflexion. The       hemorrhoids were Grade I (internal hemorrhoids that do not prolapse). Impression:           - One 2 mm polyp in the sigmoid colon, removed with a                        cold biopsy forceps. Resected and retrieved.                       - Non-bleeding internal hemorrhoids. Recommendation:       - Discharge patient to home.                       - Resume previous diet.                       - Continue present medications.                       - Await pathology results.                       - Repeat colonoscopy in 5 years for surveillance. Procedure Code(s):    --- Professional ---                       [phone], Colonoscopy, flexible; with biopsy, single or                        multiple Diagnosis Code(s):    --- Professional ---  Z86.010, Personal history of colonic polyps                       D12.5, Benign neoplasm of sigmoid colon CPT copyright 2016 American Medical Association. All rights reserved. The codes documented in this report are preliminary and upon coder review may  be revised to meet current compliance requirements. Lucilla Lame MD, MD 09/09/2017 7:53:53 AM This report has been signed electronically. Number of Addenda: 0 Note Initiated On:  09/09/2017 7:20 AM Scope Withdrawal Time: 0 hours 7 minutes 1 second  Total Procedure Duration: 0 hours 11 minutes 53 seconds       [facility]

## 2017-09-09 NOTE — Anesthesia Procedure Notes (Signed)
Procedure Name: MAC Performed by: Lejla Moeser, CRNA Pre-anesthesia Checklist: Patient identified, Emergency Drugs available, Suction available, Patient being monitored and Timeout performed Patient Re-evaluated:Patient Re-evaluated prior to induction Oxygen Delivery Method: Nasal cannula       

## 2017-09-09 NOTE — Anesthesia Preprocedure Evaluation (Signed)
Anesthesia Evaluation  Patient identified by MRN, date of birth, ID band Patient awake    Reviewed: Allergy & Precautions, NPO status , Patient's Chart, lab work & pertinent test results  History of Anesthesia Complications (+) PONV and history of anesthetic complications  Airway Mallampati: III  TM Distance: >3 FB Neck ROM: Full    Dental  (+)    Pulmonary asthma ,    Pulmonary exam normal breath sounds clear to auscultation       Cardiovascular Exercise Tolerance: Good hypertension, Normal cardiovascular exam Rhythm:Regular Rate:Normal     Neuro/Psych negative neurological ROS     GI/Hepatic negative GI ROS,   Endo/Other  negative endocrine ROS  Renal/GU negative Renal ROS     Musculoskeletal   Abdominal   Peds  Hematology negative hematology ROS (+)   Anesthesia Other Findings   Reproductive/Obstetrics                             Anesthesia Physical Anesthesia Plan  ASA: II  Anesthesia Plan: General   Post-op Pain Management:    Induction: Intravenous  PONV Risk Score and Plan: 2 and Propofol infusion and TIVA  Airway Management Planned: Natural Airway  Additional Equipment:   Intra-op Plan:   Post-operative Plan:   Informed Consent: I have reviewed the patients History and Physical, chart, labs and discussed the procedure including the risks, benefits and alternatives for the proposed anesthesia with the patient or authorized representative who has indicated his/her understanding and acceptance.     Plan Discussed with: CRNA  Anesthesia Plan Comments:         Anesthesia Quick Evaluation

## 2017-09-09 NOTE — H&P (Signed)
Lucilla Lame, MD [address] [place], [place] [address] Phone:[phone] Fax : [phone]  Primary Care Physician:  Arnetha Courser, MD Primary Gastroenterologist:  Dr. Allen Norris  Pre-Procedure History & Physical: HPI:  Julie Ayala is a 62 y.o. female is here for an colonoscopy.   Past Medical History:  Diagnosis Date  . Allergy   . Asthma   . Asthma, mild intermittent, well-controlled 07/19/2015  . Binge eating disorder   . 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 Fatty liver 08/24/2017   [place] Jan 2019  . History of chest pain    nuclear med stress test Sep 25, 2014 negative, EF 69%  . Hyperlipidemia   . Hypertension   . IFG (impaired fasting glucose)   . Insomnia   . Motion sickness    all moving things  . OCD (obsessive compulsive disorder)   . PONV (postoperative nausea and vomiting)   . Post-menopausal   . Wears contact lenses     Past Surgical History:  Procedure Laterality Date  . BREAST BIOPSY Right    core bx- neg  . CHOLECYSTECTOMY  2013  . COLONOSCOPY  06/2012   small polyps, repeat in 06/2016  . CYSTECTOMY     uterine cyst removed  . CYSTECTOMY     Hyoid cyst removed  . MELANOMA EXCISION      Prior to Admission medications   Medication Sig Start Date End Date Taking? Authorizing Provider  albuterol (PROVENTIL HFA;VENTOLIN HFA) 108 (90 Base) MCG/ACT inhaler Inhale 2 puffs into the lungs every 4 (four) hours as needed for wheezing or shortness of breath. 04/26/16  Yes Lada, Satira Anis, MD  amLODipine (NORVASC) 10 MG tablet Take 1 tablet (10 mg total) by mouth daily. 09/05/17  Yes Lada, Satira Anis, MD  aspirin EC 81 MG tablet Take 1 tablet (81 mg total) by mouth daily. 08/28/17  Yes Lada, Satira Anis, MD  budesonide-formoterol (SYMBICORT) 160-4.5 MCG/ACT inhaler Inhale 2 puffs into the lungs 2 (two) times daily. Patient taking differently: Inhale 2 puffs into the lungs 2 (two) times daily as needed.  04/26/16  Yes Lada, Satira Anis, MD  ezetimibe (ZETIA)  10 MG tablet Take 1 tablet (10 mg total) by mouth daily. 09/05/17  Yes Lada, Satira Anis, MD  fluticasone (FLONASE) 50 MCG/ACT nasal spray Place 2 sprays into both nostrils daily. Patient taking differently: Place 2 sprays into both nostrils daily as needed.  04/26/17  Yes Hubbard Hartshorn, FNP  LORazepam (ATIVAN) 0.5 MG tablet Take 0.5-1 tablets (0.25-0.5 mg total) by mouth every 6 (six) hours as needed for anxiety. 08/28/17  Yes Lada, Satira Anis, MD  losartan (COZAAR) 100 MG tablet Take 1 tablet (100 mg total) by mouth daily. 09/05/17  Yes Lada, Satira Anis, MD  pantoprazole (PROTONIX) 40 MG tablet Take 1 tablet (40 mg total) by mouth daily. Patient not taking: Reported on 09/05/2017 08/29/17 10/28/17  Lavonia Drafts, MD    Allergies as of 09/08/2017 - Review Complete 09/08/2017  Allergen Reaction Noted  . Codeine Diarrhea and Nausea And Vomiting 03/13/2015  . Pravastatin Other (See Comments) 03/13/2015  . Latex Rash 03/13/2015  . Sulfa antibiotics Rash 03/13/2015    Family History  Problem Relation Age of Onset  . Hypertension Mother   . Heart disease Mother        pacemaker  . Congestive Heart Failure Mother        with pacemaker  . Stroke Mother  TIA/CVA  . Cancer Mother        lung  . Breast cancer Mother 95  . Cancer Father        colon and lung  . Hypertension Sister   . Mental illness Sister        anxiety  . Hypothyroidism Sister   . Heart disease Brother        unknown heart condition name, EF 55%  . Hypertension Brother   . Cancer Son        melanoma  . Diabetes Paternal Aunt   . Diabetes Paternal Uncle   . Hypertension Brother   . Heart Problems Son        prolonged QT syndrome (syncope)  . Stroke Maternal Grandmother   . COPD Maternal Aunt   . Cirrhosis Maternal Grandfather     Social History   Socioeconomic History  . Marital status: Divorced    Spouse name: Not on file  . Number of children: Not on file  . Years of education: Not on file  . Highest  education level: Not on file  Social Needs  . Financial resource strain: Not on file  . Food insecurity - worry: Not on file  . Food insecurity - inability: Not on file  . Transportation needs - medical: Not on file  . Transportation needs - non-medical: Not on file  Occupational History  . Not on file  Tobacco Use  . Smoking status: Former Smoker    Years: 0.50    Types: Cigarettes    Last attempt to quit: 03/14/1987    Years since quitting: 30.5  . Smokeless tobacco: Never Used  Substance and Sexual Activity  . Alcohol use: No    Frequency: Never  . Drug use: No  . Sexual activity: Not Currently  Other Topics Concern  . Not on file  Social History Narrative  . Not on file    Review of Systems: See HPI, otherwise negative ROS  Physical Exam: BP 132/76   Pulse 82   Temp 98.1 F (36.7 C) (Temporal)   Resp 16   Ht 5\' 4"  (1.626 m)   Wt 205 lb (93 kg)   SpO2 98%   BMI 35.19 kg/m  General:   Alert,  pleasant and cooperative in NAD Head:  Normocephalic and atraumatic. Neck:  Supple; no masses or thyromegaly. Lungs:  Clear throughout to auscultation.    Heart:  Regular rate and rhythm. Abdomen:  Soft, nontender and nondistended. Normal bowel sounds, without guarding, and without rebound.   Neurologic:  Alert and  oriented x4;  grossly normal neurologically.  Impression/Plan: Julie Ayala is here for an colonoscopy to be performed for history of polyps  Risks, benefits, limitations, and alternatives regarding  colonoscopy have been reviewed with the patient.  Questions have been answered.  All parties agreeable.   Lucilla Lame, MD  09/09/2017, 7:31 AM

## 2017-09-09 NOTE — Transfer of Care (Signed)
Immediate Anesthesia Transfer of Care Note  Patient: Health visitor  Procedure(s) Performed: COLONOSCOPY WITH PROPOFOL (N/A Rectum)  Patient Location: PACU  Anesthesia Type: General  Level of Consciousness: awake, alert  and patient cooperative  Airway and Oxygen Therapy: Patient Spontanous Breathing and Patient connected to supplemental oxygen  Post-op Assessment: Post-op Vital signs reviewed, Patient's Cardiovascular Status Stable, Respiratory Function Stable, Patent Airway and No signs of Nausea or vomiting  Post-op Vital Signs: Reviewed and stable  Complications: No apparent anesthesia complications

## 2017-09-09 NOTE — Anesthesia Postprocedure Evaluation (Signed)
Anesthesia Post Note  Patient: Health visitor  Procedure(s) Performed: COLONOSCOPY WITH PROPOFOL (N/A Rectum) POLYPECTOMY (Rectum)  Patient location during evaluation: PACU Anesthesia Type: General Level of consciousness: awake and alert, oriented and patient cooperative Pain management: pain level controlled Vital Signs Assessment: post-procedure vital signs reviewed and stable Respiratory status: spontaneous breathing, nonlabored ventilation and respiratory function stable Cardiovascular status: blood pressure returned to baseline and stable Postop Assessment: adequate PO intake Anesthetic complications: no    Darrin Nipper

## 2017-09-12 ENCOUNTER — Encounter: Payer: Self-pay | Admitting: Gastroenterology

## 2017-09-12 ENCOUNTER — Encounter: Payer: Self-pay | Admitting: Family Medicine

## 2017-09-12 ENCOUNTER — Other Ambulatory Visit: Payer: Self-pay | Admitting: Family Medicine

## 2017-09-12 ENCOUNTER — Telehealth: Payer: Self-pay

## 2017-09-12 ENCOUNTER — Other Ambulatory Visit: Payer: Self-pay

## 2017-09-12 DIAGNOSIS — I10 Essential (primary) hypertension: Secondary | ICD-10-CM

## 2017-09-12 DIAGNOSIS — R825 Elevated urine levels of drugs, medicaments and biological substances: Secondary | ICD-10-CM

## 2017-09-12 DIAGNOSIS — R8290 Unspecified abnormal findings in urine: Secondary | ICD-10-CM

## 2017-09-12 LAB — CATECHOLAMINE+VMA, 24-HR URINE
DOPAMINE 24H UR: 369 ug/(24.h) (ref 0–510)
Dopamine, Rand Ur: 157 ug/L
EPINEPHRINE 24H UR: 2 ug/(24.h) (ref 0–20)
Epinephrine, Rand Ur: 1 ug/L
NOREPINEPH RAND UR: 39 ug/L
Norepinephrine, 24H Ur: 92 ug/24 hr (ref 0–135)
VMA 24H UR ADULT: 5.2 mg/(24.h) (ref 0.0–7.5)
VMA, URINE: 2.2 mg/L

## 2017-09-12 LAB — METANEPHRINES, URINE, 24 HOUR
METANEPH TOTAL UR: 25 ug/L
METANEPHRINES 24H UR: 59 ug/(24.h) (ref 45–290)
NORMETANEPHRINE UR: 322 ug/L
Normetanephrine, 24H Ur: 757 ug/24 hr — ABNORMAL HIGH (ref 82–500)

## 2017-09-12 NOTE — Progress Notes (Signed)
Great, I entered elevated catecholamines, signed order

## 2017-09-12 NOTE — Progress Notes (Signed)
Referral entered to ENDO; did not want this dropped

## 2017-09-12 NOTE — Telephone Encounter (Signed)
Copied from [place] [id number]. Topic: Inquiry >> Sep 08, 2017  3:50 PM Arletha Grippe wrote: Reason for CRM: pt has been getting notifications from [facility] , but is unable to see them. Pt would like call back with an update. She sent in a request on mychart this morning.  Cb# [phone]    Called pt she states that issues w/ mychart have resolved no issues currently.

## 2017-09-12 NOTE — Assessment & Plan Note (Signed)
Order MRI abd

## 2017-09-13 ENCOUNTER — Other Ambulatory Visit: Payer: Self-pay | Admitting: Family Medicine

## 2017-09-13 ENCOUNTER — Encounter: Payer: Self-pay | Admitting: Family Medicine

## 2017-09-13 LAB — SURGICAL PATHOLOGY

## 2017-09-14 ENCOUNTER — Encounter: Payer: Self-pay | Admitting: Gastroenterology

## 2017-09-17 ENCOUNTER — Ambulatory Visit
Admission: RE | Admit: 2017-09-17 | Discharge: 2017-09-17 | Disposition: A | Discharge status: Home / Self Care | Payer: Managed Care, Other (non HMO) | Source: Ambulatory Visit | Attending: Family Medicine | Admitting: Family Medicine

## 2017-09-17 DIAGNOSIS — K760 Fatty (change of) liver, not elsewhere classified: Secondary | ICD-10-CM | POA: Diagnosis not present

## 2017-09-17 DIAGNOSIS — E278 Other specified disorders of adrenal gland: Secondary | ICD-10-CM | POA: Insufficient documentation

## 2017-09-17 DIAGNOSIS — R8290 Unspecified abnormal findings in urine: Secondary | ICD-10-CM | POA: Diagnosis not present

## 2017-09-17 MED ORDER — GADOBENATE DIMEGLUMINE 529 MG/ML IV SOLN
20.0000 mL | Freq: Once | INTRAVENOUS | Status: AC | PRN
  Administered 2017-09-17: 19 mL via INTRAVENOUS

## 2017-09-19 ENCOUNTER — Encounter: Payer: Self-pay | Admitting: Family Medicine

## 2017-09-19 ENCOUNTER — Telehealth: Payer: Self-pay | Admitting: Family Medicine

## 2017-09-19 NOTE — Telephone Encounter (Unsigned)
Copied from [place] #[id number]. Topic: Quick Communication - See Telephone Encounter >> Sep 19, 2017 12:48 PM Percell Belt A wrote: CRM for notification. See Telephone encounter for: [employer] called in  ( they manage pt short term disability) they need the confirmation of dates supported form faxed back other to them as soon as possible  Fax number [phone]   09/19/17.

## 2017-09-26 ENCOUNTER — Encounter: Payer: Self-pay | Admitting: Family Medicine

## 2017-09-28 ENCOUNTER — Encounter: Payer: Self-pay | Admitting: Family Medicine

## 2017-10-03 ENCOUNTER — Encounter: Payer: Self-pay | Admitting: Family Medicine

## 2017-10-04 ENCOUNTER — Encounter: Payer: Self-pay | Admitting: Family Medicine

## 2017-10-06 ENCOUNTER — Encounter: Payer: Self-pay | Admitting: Family Medicine

## 2017-10-07 MED ORDER — LORCASERIN HCL 10 MG PO TABS: 1.0000 | ORAL_TABLET | Freq: Two times a day (BID) | ORAL | 2 refills | Status: DC

## 2017-10-11 ENCOUNTER — Encounter: Payer: Self-pay | Admitting: Family Medicine

## 2017-10-12 ENCOUNTER — Telehealth: Payer: Self-pay | Admitting: Family Medicine

## 2017-10-12 NOTE — Telephone Encounter (Signed)
Copied from [place]. Topic: Quick Communication - See Telephone Encounter >> Oct 12, 2017  3:06 PM Bea Graff, NT wrote: CRM for notification. See Telephone encounter for: Julie Ayala from [facility] is calling and requesting the doctor be more specific as to what eating disorder this pt has that she cannot take Contrave and that the doctor is requesting  Lorcaserin HCl (BELVIQ). CB#: [phone] Ref#:[id number]  10/12/17.

## 2017-10-12 NOTE — Telephone Encounter (Signed)
She has binge eating disorder

## 2017-10-13 NOTE — Telephone Encounter (Signed)
[facility] notified. 

## 2017-10-18 ENCOUNTER — Encounter: Payer: Self-pay | Admitting: Family Medicine

## 2017-10-18 MED ORDER — LORAZEPAM 0.5 MG PO TABS: 0.2500 mg | ORAL_TABLET | Freq: Four times a day (QID) | ORAL | 0 refills | Status: DC | PRN

## 2017-10-18 NOTE — Telephone Encounter (Signed)
Allowing limited Rx for medicine

## 2017-10-25 ENCOUNTER — Encounter: Payer: Self-pay | Admitting: Family Medicine

## 2017-10-25 ENCOUNTER — Other Ambulatory Visit: Payer: Self-pay

## 2017-10-25 DIAGNOSIS — R899 Unspecified abnormal finding in specimens from other organs, systems and tissues: Secondary | ICD-10-CM

## 2017-11-18 ENCOUNTER — Telehealth: Payer: Self-pay

## 2017-11-18 NOTE — Telephone Encounter (Signed)
Copied from [place]. Topic: General - Other >> Nov 18, 2017 11:59 AM Bea Graff, NT wrote: Reason for CRM: Dr. De [place]ingshead office is needing the CT of abdomen and pelvis from 05/24/14 faxed to them. Fax#: [phone]  The requested information has been faxed.

## 2017-11-21 ENCOUNTER — Encounter: Payer: Self-pay | Admitting: Family Medicine

## 2017-11-22 ENCOUNTER — Encounter: Payer: Self-pay | Admitting: Family Medicine

## 2017-11-22 DIAGNOSIS — R600 Localized edema: Secondary | ICD-10-CM

## 2017-11-22 DIAGNOSIS — I10 Essential (primary) hypertension: Secondary | ICD-10-CM

## 2017-11-30 ENCOUNTER — Ambulatory Visit: Payer: Managed Care, Other (non HMO) | Admitting: Internal Medicine

## 2017-11-30 MED ORDER — FUROSEMIDE 20 MG PO TABS: 20.0000 mg | ORAL_TABLET | Freq: Two times a day (BID) | ORAL | 0 refills | Status: DC

## 2017-11-30 MED ORDER — POTASSIUM CHLORIDE ER 10 MEQ PO TBCR: 10.0000 meq | EXTENDED_RELEASE_TABLET | Freq: Two times a day (BID) | ORAL | 0 refills | Status: DC

## 2017-11-30 NOTE — Addendum Note (Signed)
Addended by: Airica Schwartzkopf, Satira Anis on: 11/30/2017 01:53 PM   Modules accepted: Orders

## 2017-12-08 ENCOUNTER — Ambulatory Visit
Admission: RE | Admit: 2017-12-08 | Discharge: 2017-12-08 | Disposition: A | Discharge status: Home / Self Care | Payer: Managed Care, Other (non HMO) | Source: Ambulatory Visit | Attending: Family Medicine | Admitting: Family Medicine

## 2017-12-08 ENCOUNTER — Encounter: Payer: Self-pay | Admitting: Family Medicine

## 2017-12-08 DIAGNOSIS — E785 Hyperlipidemia, unspecified: Secondary | ICD-10-CM | POA: Insufficient documentation

## 2017-12-08 DIAGNOSIS — R600 Localized edema: Secondary | ICD-10-CM | POA: Insufficient documentation

## 2017-12-08 DIAGNOSIS — I10 Essential (primary) hypertension: Secondary | ICD-10-CM | POA: Diagnosis not present

## 2017-12-08 DIAGNOSIS — I2721 Secondary pulmonary arterial hypertension: Secondary | ICD-10-CM | POA: Insufficient documentation

## 2017-12-08 HISTORY — DX: Secondary pulmonary arterial hypertension: I27.21

## 2017-12-08 NOTE — Progress Notes (Signed)
*  PRELIMINARY RESULTS* Echocardiogram 2D Echocardiogram has been performed.  Julie Ayala 12/08/2017, 11:41 AM

## 2017-12-09 ENCOUNTER — Encounter: Payer: Self-pay | Admitting: Internal Medicine

## 2017-12-09 ENCOUNTER — Ambulatory Visit: Payer: Managed Care, Other (non HMO) | Admitting: Internal Medicine

## 2017-12-09 ENCOUNTER — Encounter: Payer: Self-pay | Admitting: Family Medicine

## 2017-12-09 VITALS — BP 130/76 | HR 64 | Ht 64.0 in | Wt 193.2 lb

## 2017-12-09 DIAGNOSIS — R600 Localized edema: Secondary | ICD-10-CM

## 2017-12-09 DIAGNOSIS — R0789 Other chest pain: Secondary | ICD-10-CM

## 2017-12-09 DIAGNOSIS — I10 Essential (primary) hypertension: Secondary | ICD-10-CM

## 2017-12-09 MED ORDER — METOPROLOL SUCCINATE ER 25 MG PO TB24: 25.0000 mg | ORAL_TABLET | Freq: Every day | ORAL | 3 refills | Status: DC

## 2017-12-09 MED ORDER — FUROSEMIDE 40 MG PO TABS: 40.0000 mg | ORAL_TABLET | Freq: Every day | ORAL | 3 refills | Status: DC

## 2017-12-09 NOTE — Patient Instructions (Addendum)
Medication Instructions: - Your physician has recommended you make the following change in your medication:  1) START lasix (furosemide) 40 mg- take 1 tablet by mouth once daily 2) STOP norvasc (amlodipine)  - make sure you are not taking any potassium supplements or metoprolol at home  Labwork: - Your physician recommends that you have lab work today: BMP  Procedures/Testing: - none ordered  Follow-Up: - Your physician recommends that you schedule a follow-up appointment in: 1 month with Julie Ayala, [place] / Julie Stabs, NP   Any Additional Special Instructions Will Be Listed Below (If Applicable). - Please take & record your blood pressure readings at home. If your blood pressure is running > 140/90 consistently, then please call the office.    If you need a refill on your cardiac medications before your next appointment, please call your pharmacy.

## 2017-12-09 NOTE — Progress Notes (Signed)
Follow-up Outpatient Visit Date: 12/09/2017  Primary Care Provider: Arnetha Courser, MD [address] [place] [address]  Chief Complaint: Leg swelling  HPI:  Julie Ayala is a 62 y.o. year-old female with history of hypertension, hyperlipidemia, borderline diabetes, and asthma.  I saw Julie Ayala due to atypical chest pain in January.  At that time, she was asymptomatic.  EKG showed nonspecific T wave changes.  She had already had multiple ischemic evaluations over the preceding 10 to 15 years (most recently 09/2014); we therefore deferred additional testing.  She underwent transthoracic echocardiogram ordered by Dr. Sanda Klein yesterday for evaluation of leg edema.  She has been referred to see [place] today due to finding of borderline elevated pulmonary artery pressure (RVSP 34 mmHg).  MRI of the abdomen in February was notable for a small adrenal nodule unchanged since 2013, felt to be consistent with a benign adenoma.  Today, Julie Ayala reports feeling well except for leg swelling that began about 10 weeks ago.  It extends from her knees distally and at times has been extremely painful.  She was given a 3-day prescription of furosemide and potassium chloride by Dr. Sanda Klein with some transient improvement in her swelling.  Julie Ayala was concerned that losartan may be contributing to her edema and discontinued the medication.  She has noticed slight improvement with this.  She remains on amlodipine 10 mg daily.  It appears that amlodipine was discontinued today and a prescription for metoprolol sent in by Dr. Sanda Klein.  However, the patient was not informed of this change.  Julie Ayala reports being on HCTZ briefly last year due to mild leg edema.  This did not seem to help and it was subsequently stopped in favor of losartan for management of her blood pressure.  She denies chest pain, shortness of breath, orthopnea, PND, palpitations, and lightheadedness.  She typically sleeps well.  She has not been told  that she snores.  She feels refreshed in the morning and does not nap during the day.  --------------------------------------------------------------------------------------------------   Cardiovascular History & Procedures: Cardiovascular Problems:  Atypical chest pain  Leg swelling  Risk Factors:  Hypertension, hyperlipidemia, borderline diabetes mellitus, and obesity  Cath/PCI:  None  CV Surgery:  None  EP Procedures and Devices:  None  Non-Invasive Evaluation(s):  Echocardiogram (12/08/2017): Normal LV size.  LVEF 60 to 65% with normal wall motion and diastolic function.  Normal left atrial size.  Normal RV size and function.  Borderline elevated PA pressure (34 mmHg).  Renal artery Doppler (09/01/2017): No evidence of hemodynamically significant renal artery stenosis.  Normal sonographic appearance of the kidneys.  Incidentally noted hepatic steatosis.  Echocardiogram (10/01/14): Normal LVEF. Mild TR/PR. Normal RV size and function.  Myocardial perfusion stress test (09/2014): Formal report not available. Discharge summary notes study "within normal limits" with LVEF 69%.  Exercise myocardial perfusion stress test (06/25/10): Normal study without ischemia or scar. LVEF 80%. Hypertensive blood pressure response.  Exercise myocardial perfusion stress test (02/18/05): Low risk study with fixed inferior defect likely representing attenuation artifact.  No ischemia.  LVEF 58%.   Recent CV Pertinent Labs: Lab Results  Component Value Date   CHOL 250 (H) 12/08/2016   HDL 47 12/08/2016   LDLCALC 158 (H) 12/08/2016   TRIG 223 (H) 12/08/2016   CHOLHDL 5.3 (H) 12/08/2016   K 4.4 12/09/2017   BUN 9 12/09/2017   CREATININE 0.81 12/09/2017    Past medical and surgical history were reviewed and  updated in EPIC.  Current Meds  Medication Sig  . albuterol (PROVENTIL HFA;VENTOLIN HFA) 108 (90 Base) MCG/ACT inhaler Inhale 2 puffs into the lungs every 4 (four) hours as  needed for wheezing or shortness of breath.  Marland Kitchen aspirin EC 81 MG tablet Take 1 tablet (81 mg total) by mouth daily.  . budesonide-formoterol (SYMBICORT) 160-4.5 MCG/ACT inhaler Inhale 2 puffs into the lungs 2 (two) times daily. (Patient taking differently: Inhale 2 puffs into the lungs 2 (two) times daily as needed. )  . ezetimibe (ZETIA) 10 MG tablet Take 1 tablet (10 mg total) by mouth daily.  . fluticasone (FLONASE) 50 MCG/ACT nasal spray Place 2 sprays into both nostrils daily. (Patient taking differently: Place 2 sprays into both nostrils daily as needed. )  . LORazepam (ATIVAN) 0.5 MG tablet Take 0.5-1 tablets (0.25-0.5 mg total) by mouth every 6 (six) hours as needed for anxiety. Use sparingly    Allergies: Codeine; Pravastatin; Latex; and Sulfa antibiotics  Social History   Tobacco Use  . Smoking status: Former Smoker    Years: 0.50    Types: Cigarettes    Last attempt to quit: 03/14/1987    Years since quitting: 30.7  . Smokeless tobacco: Never Used  Substance Use Topics  . Alcohol use: No    Frequency: Never  . Drug use: No    Family History  Problem Relation Age of Onset  . Hypertension Mother   . Heart disease Mother        pacemaker  . Congestive Heart Failure Mother        with pacemaker  . Stroke Mother        TIA/CVA  . Cancer Mother        lung  . Breast cancer Mother 34  . Cancer Father        colon and lung  . Hypertension Sister   . Mental illness Sister        anxiety  . Hypothyroidism Sister   . Heart disease Brother        unknown heart condition name, EF 55%  . Hypertension Brother   . Cancer Son        melanoma  . Diabetes Paternal Aunt   . Diabetes Paternal Uncle   . Hypertension Brother   . Heart Problems Son        prolonged QT syndrome (syncope)  . Stroke Maternal Grandmother   . COPD Maternal Aunt   . Cirrhosis Maternal Grandfather     Review of Systems: A 12-system review of systems was performed and was negative except as noted  in the HPI.  --------------------------------------------------------------------------------------------------  Physical Exam: BP 130/76 (BP Location: Left Arm, Patient Position: Sitting, Cuff Size: Normal)   Pulse 64   Ht 5\' 4"  (1.626 m)   Wt 193 lb 4 oz (87.7 kg)   BMI 33.17 kg/m   General: Obese woman, seated comfortably in the exam room. HEENT: No conjunctival pallor or scleral icterus. Moist mucous membranes.  OP clear. Neck: Supple without lymphadenopathy, thyromegaly, JVD, or HJR. No carotid bruit. Lungs: Normal work of breathing. Clear to auscultation bilaterally without wheezes or crackles. Heart: Regular rate and rhythm without murmurs, rubs, or gallops.  Unable to assess PMI due to body habitus. Abd: Bowel sounds present. Soft, NT/ND.  Unable to assess HSM due to body habitus. Ext: 1-2+ pitting edema extending from the proximal calves distally. Radial, PT, and DP pulses are 2+ bilaterally. Skin: Warm and dry without rash.  Lab  Results  Component Value Date   WBC 5.9 08/29/2017   HGB 15.4 08/29/2017   HCT 44.8 08/29/2017   MCV 86.1 08/29/2017   PLT 288 08/29/2017    Lab Results  Component Value Date   NA 144 12/09/2017   K 4.4 12/09/2017   CL 107 (H) 12/09/2017   CO2 21 12/09/2017   BUN 9 12/09/2017   CREATININE 0.81 12/09/2017   GLUCOSE 97 12/09/2017   ALT 81 (H) 09/05/2017    Lab Results  Component Value Date   CHOL 250 (H) 12/08/2016   HDL 47 12/08/2016   LDLCALC 158 (H) 12/08/2016   TRIG 223 (H) 12/08/2016   CHOLHDL 5.3 (H) 12/08/2016    --------------------------------------------------------------------------------------------------  ASSESSMENT AND PLAN: Lower extremity edema I suspect this is most likely a side effect from amlodipine.  Edema began a few weeks after amlodipine was started.  Julie Ayala had some modest improvement with furosemide.  Recent echo showed normal left and right heart function.  Pulmonary artery pressures were borderline  elevated but not indicative of significant pulmonary hypertension.  I have recommended that Julie Ayala stop the amlodipine and begin furosemide 40 mg daily.  We will check a basic metabolic panel today.  I advised Julie Ayala to monitor her blood pressure at home and to let us know if it is consistently over 140/90.  I would defer additional cardiac work-up at this time, as Julie Ayala is otherwise asymptomatic.  Hypertension Blood pressure upper normal today.  Current therapy consists only of amlodipine 10 mg daily.  As above, we will discontinue this due to suspicion that this is causing her leg edema.  I will start furosemide today and have Julie Ayala monitor her blood pressure at home.  If it begins to rise above 140/90, I would recommend adding ACE inhibitor/ARB +/-HCTZ.  Atypical chest pain No recurrence since her last visit.  Continue primary prevention without additional testing at this time.  Follow-up: Return to clinic in 1 month.  Nelva Bush, MD 12/10/2017 2:12 PM

## 2017-12-10 ENCOUNTER — Encounter: Payer: Self-pay | Admitting: Internal Medicine

## 2017-12-10 DIAGNOSIS — R600 Localized edema: Secondary | ICD-10-CM | POA: Insufficient documentation

## 2017-12-10 LAB — BASIC METABOLIC PANEL
BUN/Creatinine Ratio: 11 (ref 9–23)
BUN: 9 mg/dL (ref 6–24)
CALCIUM: 9.7 mg/dL (ref 8.7–10.2)
CO2: 21 mmol/L (ref 20–29)
Chloride: 107 mmol/L — ABNORMAL HIGH (ref 96–106)
Creatinine, Ser: 0.81 mg/dL (ref 0.57–1.00)
GFR calc Af Amer: 92 mL/min/{1.73_m2} (ref >59)
GFR calc non Af Amer: 80 mL/min/{1.73_m2} (ref >59)
Glucose: 97 mg/dL (ref 65–99)
POTASSIUM: 4.4 mmol/L (ref 3.5–5.2)
Sodium: 144 mmol/L (ref 134–144)

## 2018-01-02 ENCOUNTER — Encounter

## 2018-01-02 ENCOUNTER — Encounter: Payer: Self-pay | Admitting: Family Medicine

## 2018-01-02 ENCOUNTER — Ambulatory Visit: Payer: Managed Care, Other (non HMO) | Admitting: Family Medicine

## 2018-01-02 VITALS — BP 122/78 | HR 71 | Temp 98.2°F | Resp 14 | Ht 64.0 in

## 2018-01-02 DIAGNOSIS — K760 Fatty (change of) liver, not elsewhere classified: Secondary | ICD-10-CM

## 2018-01-02 DIAGNOSIS — R7401 Elevation of levels of liver transaminase levels: Secondary | ICD-10-CM

## 2018-01-02 DIAGNOSIS — I10 Essential (primary) hypertension: Secondary | ICD-10-CM | POA: Diagnosis not present

## 2018-01-02 DIAGNOSIS — I2721 Secondary pulmonary arterial hypertension: Secondary | ICD-10-CM | POA: Diagnosis not present

## 2018-01-02 DIAGNOSIS — E785 Hyperlipidemia, unspecified: Secondary | ICD-10-CM | POA: Diagnosis not present

## 2018-01-02 DIAGNOSIS — R740 Nonspecific elevation of levels of transaminase and lactic acid dehydrogenase [LDH]: Secondary | ICD-10-CM

## 2018-01-02 DIAGNOSIS — R7301 Impaired fasting glucose: Secondary | ICD-10-CM | POA: Diagnosis not present

## 2018-01-02 NOTE — Assessment & Plan Note (Signed)
Seeing cardiologist; note to him to see if spironolactone might be helpful instead of furosemide

## 2018-01-02 NOTE — Assessment & Plan Note (Signed)
Expect this to be improving with her efforts at weight loss

## 2018-01-02 NOTE — Progress Notes (Signed)
BP 122/78   Pulse 71   Temp 98.2 F (36.8 C) (Oral)   Resp 14   Ht 5\' 4"  (1.626 m)   SpO2 93%   BMI 33.17 kg/m    Subjective:    Patient ID: Julie Ayala, female    DOB: 11-18-1955, 62 y.o.   MRN: [id number]  HPI: Julie Ayala is a 62 y.o. female  Chief Complaint  Patient presents with  . Follow-up  . Hyperlipidemia    endo suggest med change    HPI Patient is here for follow-up She has considered doing 16/8, intermittent fasting; she started and has been diligent ever since She eats what she wants in moderation Lost 26 pounds since January; can feel clothes are fitting looser Saw [facility] Moderate carbs, low fat, adequate protein Finishes eating around 5:30 pm and then 12 hours to breakfast Might even have a cookie so she doesn't deprive herself Staying hydrated  Dr. Saunders Revel has started her on lasix, and then she was to go to PRN dosing Fluid was more by knees and lower; started lasix and within 36 hours everything was back to normal, her new normal; can go 3 days, but not 4 without a dose; started back up Thursday He checked her K+, normal; BUN was okay  Seeing Dr. Ronnald Collum feels like she needs to be on a cholesterol medicine; fatty liver Did not tolerate pravastatin before; already on zetia  Depression screen [facility] 2/9 01/02/2018 08/25/2017 07/19/2017 05/23/2017 04/26/2017  Decreased Interest 0 0 0 0 0  Down, Depressed, Hopeless 0 0 0 0 0  PHQ - 2 Score 0 0 0 0 0    Relevant past medical, surgical, family and social history reviewed Past Medical History:  Diagnosis Date  . Allergy   . Asthma   . Asthma, mild intermittent, well-controlled 07/19/2015  . Binge eating disorder   . 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 Fatty liver 08/24/2017   [place] Jan 2019  . History of chest pain    nuclear med stress test Sep 25, 2014 negative, EF 69%  . Hyperlipidemia   . Hypertension   . IFG (impaired fasting glucose)   . Insomnia   . Motion sickness    all moving  things  . OCD (obsessive compulsive disorder)   . PONV (postoperative nausea and vomiting)   . Post-menopausal   . Pulmonary artery hypertension ([place]) 12/08/2017   Echo April 2019  . Wears contact lenses    Past Surgical History:  Procedure Laterality Date  . BREAST BIOPSY Right    core bx- neg  . CHOLECYSTECTOMY  2013  . COLONOSCOPY  06/2012   small polyps, repeat in 06/2016  . COLONOSCOPY WITH PROPOFOL N/A 09/09/2017   Procedure: COLONOSCOPY WITH PROPOFOL;  Surgeon: Lucilla Lame, MD;  Location: [facility];  Service: Endoscopy;  Laterality: N/A;  . CYSTECTOMY     uterine cyst removed  . CYSTECTOMY     Hyoid cyst removed  . MELANOMA EXCISION    . POLYPECTOMY  09/09/2017   Procedure: POLYPECTOMY;  Surgeon: Lucilla Lame, MD;  Location: [facility];  Service: Endoscopy;;   Family History  Problem Relation Age of Onset  . Hypertension Mother   . Heart disease Mother        pacemaker  . Congestive Heart Failure Mother        with pacemaker  . Stroke Mother        TIA/CVA  . Cancer Mother  lung  . Breast cancer Mother 50  . Cancer Father        colon and lung  . Hypertension Sister   . Mental illness Sister        anxiety  . Hypothyroidism Sister   . Heart disease Brother        unknown heart condition name, EF 55%  . Hypertension Brother   . Cancer Son        melanoma  . Diabetes Paternal Aunt   . Diabetes Paternal Uncle   . Hypertension Brother   . Heart Problems Son        prolonged QT syndrome (syncope)  . Stroke Maternal Grandmother   . COPD Maternal Aunt   . Cirrhosis Maternal Grandfather    Social History   Tobacco Use  . Smoking status: Former Smoker    Years: 0.50    Types: Cigarettes    Last attempt to quit: 03/14/1987    Years since quitting: 30.8  . Smokeless tobacco: Never Used  Substance Use Topics  . Alcohol use: No    Frequency: Never  . Drug use: No    Interim medical history since last visit reviewed. Allergies  and medications reviewed  Review of Systems Per HPI unless specifically indicated above     Objective:    BP 122/78   Pulse 71   Temp 98.2 F (36.8 C) (Oral)   Resp 14   Ht 5\' 4"  (1.626 m)   SpO2 93%   BMI 33.17 kg/m   Wt Readings from Last 3 Encounters:  12/09/17 193 lb 4 oz (87.7 kg)  09/09/17 205 lb (93 kg)  09/05/17 208 lb 14.4 oz (94.8 kg)    Physical Exam  Constitutional: She appears well-developed and well-nourished.  Weight loss noted  HENT:  Mouth/Throat: Mucous membranes are normal.  Eyes: EOM are normal. No scleral icterus.  Cardiovascular: Normal rate and regular rhythm.  Pulmonary/Chest: Effort normal and breath sounds normal.  Musculoskeletal: She exhibits no edema.  Neurological: She is alert.  Psychiatric: She has a normal mood and affect. Her behavior is normal.    Results for orders placed or performed in visit on 96/29/52  Basic metabolic panel  Result Value Ref Range   Glucose 97 65 - 99 mg/dL   BUN 9 6 - 24 mg/dL   Creatinine, Ser 0.81 0.57 - 1.00 mg/dL   GFR calc non Af Amer 80 >59 mL/min/1.73   GFR calc Af Amer 92 >59 mL/min/1.73   BUN/Creatinine Ratio 11 9 - 23   Sodium 144 134 - 144 mmol/L   Potassium 4.4 3.5 - 5.2 mmol/L   Chloride 107 (H) 96 - 106 mmol/L   CO2 21 20 - 29 mmol/L   Calcium 9.7 8.7 - 10.2 mg/dL      Assessment & Plan:   Problem List Items Addressed This Visit      Cardiovascular and Mediastinum   Pulmonary artery hypertension [facility])    Seeing cardiologist; note to him to see if spironolactone might be helpful instead of furosemide      Essential hypertension, benign    Well-controlled on its own      Relevant Orders   Hepatic function panel     Digestive   Fatty liver - Primary    Expect this to be improving with her efforts at weight loss      Relevant Orders   Hepatic function panel     Endocrine   IFG (impaired fasting glucose)  Next A1c due in July; expect this to improve too        Other    Elevated serum glutamic pyruvic transaminase (SGPT) level   Relevant Orders   Hepatic function panel   Hyperlipidemia    Check lipids today; not fasting today, handful of peanuts and cheese; LDL should not matter; on zetia, consider adding intermittent statin      Relevant Orders   Lipid panel       Follow up plan: No follow-ups on file.  An after-visit summary was printed and given to the patient at [place].  Please see the patient instructions which may contain other information and recommendations beyond what is mentioned above in the assessment and plan.  No orders of the defined types were placed in this encounter.   Orders Placed This Encounter  Procedures  . Lipid panel  . Hepatic function panel

## 2018-01-02 NOTE — Assessment & Plan Note (Signed)
Well-controlled on its own

## 2018-01-02 NOTE — Assessment & Plan Note (Signed)
Next A1c due in July; expect this to improve too

## 2018-01-02 NOTE — Patient Instructions (Addendum)
Let's get labs today If you have not heard anything from my staff in a week about any orders/referrals/studies from today, please contact us here to follow-up [phone] Keep up the great job with your eating and lifestyle Let Dr. Saunders Revel know if your swelling continues as we might try something else

## 2018-01-02 NOTE — Assessment & Plan Note (Signed)
Check lipids today; not fasting today, handful of peanuts and cheese; LDL should not matter; on zetia, consider adding intermittent statin

## 2018-01-11 ENCOUNTER — Ambulatory Visit: Payer: Managed Care, Other (non HMO) | Admitting: Nurse Practitioner

## 2018-01-11 LAB — HEPATIC FUNCTION PANEL
ALBUMIN: 4.6 g/dL (ref 3.5–5.5)
ALK PHOS: 87 IU/L (ref 39–117)
ALT: 29 IU/L (ref 0–32)
AST: 20 IU/L (ref 0–40)
Bilirubin Total: 0.3 mg/dL (ref 0.0–1.2)
Bilirubin, Direct: 0.11 mg/dL (ref 0.00–0.40)
Total Protein: 6.6 g/dL (ref 6.0–8.5)

## 2018-01-11 LAB — LIPID PANEL
CHOLESTEROL TOTAL: 223 mg/dL — AB (ref 100–199)
Chol/HDL Ratio: 5 ratio — ABNORMAL HIGH (ref 0.0–4.4)
HDL: 45 mg/dL (ref >39)
LDL Calculated: 131 mg/dL — ABNORMAL HIGH (ref 0–99)
TRIGLYCERIDES: 233 mg/dL — AB (ref 0–149)
VLDL CHOLESTEROL CAL: 47 mg/dL — AB (ref 5–40)

## 2018-01-12 ENCOUNTER — Encounter: Payer: Self-pay | Admitting: Family Medicine

## 2018-01-17 ENCOUNTER — Ambulatory Visit: Payer: Managed Care, Other (non HMO) | Admitting: Nurse Practitioner

## 2018-01-17 ENCOUNTER — Encounter: Payer: Self-pay | Admitting: Nurse Practitioner

## 2018-01-17 VITALS — BP 124/78 | HR 68 | Ht 64.0 in | Wt 190.5 lb

## 2018-01-17 DIAGNOSIS — I2721 Secondary pulmonary arterial hypertension: Secondary | ICD-10-CM | POA: Diagnosis not present

## 2018-01-17 DIAGNOSIS — I10 Essential (primary) hypertension: Secondary | ICD-10-CM | POA: Diagnosis not present

## 2018-01-17 DIAGNOSIS — R600 Localized edema: Secondary | ICD-10-CM
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
## 2018-01-17 NOTE — Patient Instructions (Signed)
Medication Instructions: - Your physician recommends that you continue on your current medications as directed. Please refer to the Current Medication list given to you today.  Labwork: - none ordered  Procedures/Testing: - none ordered  Follow-Up: - Your physician wants you to follow-up in: 4-6 months with Dr. Saunders Revel. You will receive a reminder letter in the mail two months in advance. If you don't receive a letter, please call our office to schedule the follow-up appointment.   Any Additional Special Instructions Will Be Listed Below (If Applicable).     If you need a refill on your cardiac medications before your next appointment, please call your pharmacy.

## 2018-01-18 ENCOUNTER — Telehealth: Payer: Self-pay | Admitting: *Deleted

## 2018-01-18 DIAGNOSIS — I10 Essential (primary) hypertension: Secondary | ICD-10-CM

## 2018-01-18 DIAGNOSIS — I2721 Secondary pulmonary arterial hypertension: Secondary | ICD-10-CM

## 2018-01-18 NOTE — Telephone Encounter (Signed)
No answer. Left message to call back if any questions regarding referral being placed to pulmonology for sleep study. Referral entered into Epic and message sent to scheduling.

## 2018-01-18 NOTE — Telephone Encounter (Signed)
-----   Message from Theora Gianotti, NP sent at 01/17/2018  5:17 PM EDT ----- Farris Has,  I forgot that I had discussed pulm referral for sleep study with this pt today.  She's End's pt.  Could you pls arrange?  Thanks,  Gerald Stabs

## 2018-01-20 NOTE — Telephone Encounter (Signed)
Attempted to schedule.  No ans no vm .  Patient referral in wq will try another time.

## 2018-01-25 ENCOUNTER — Encounter: Payer: Self-pay | Admitting: Family Medicine

## 2018-01-25 IMAGING — US US ABDOMEN LIMITED
1 series · 14 of 25 positions shown · non-contrast
Comparison: Abdominal and pelvic CT scan May 24, 2014 and
abdominal ultrasound August 12, 2011

CLINICAL DATA: Elevated liver function studies. History of previous
cholecystectomy. History of melanoma.

EXAM:
ULTRASOUND ABDOMEN LIMITED RIGHT UPPER QUADRANT

[Series 1: us abdomen limited · 0.28mm/px · 14 of 49 slices shown]
[im 1/49]
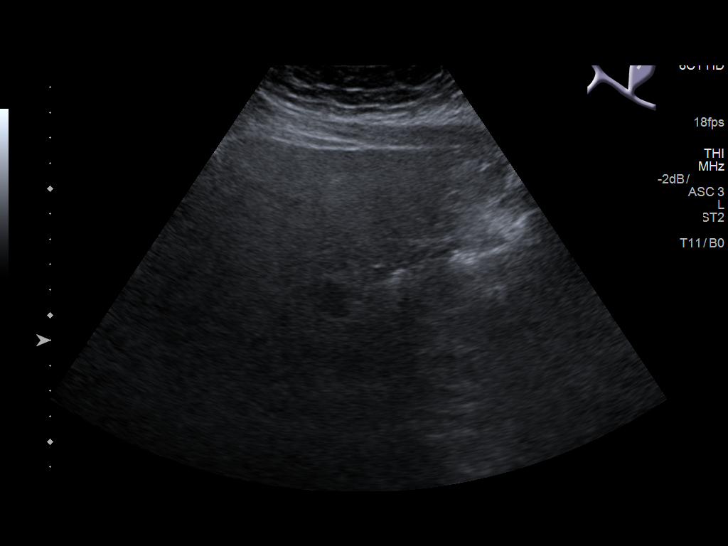
[im 5/49]
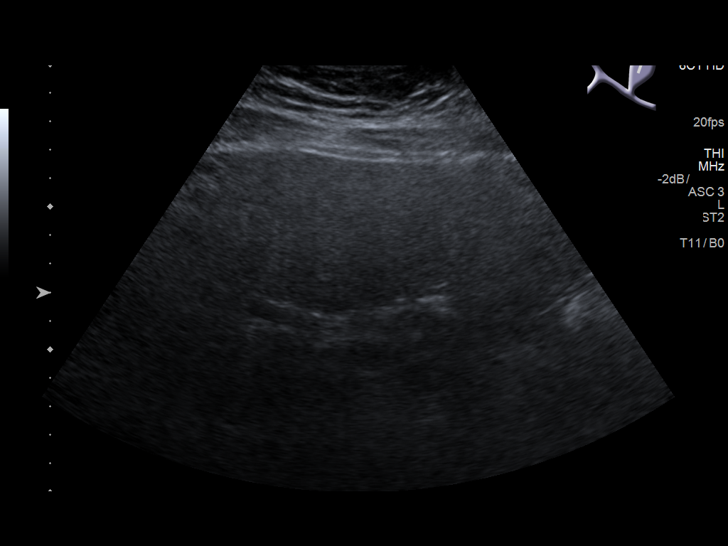
[im 9/49]
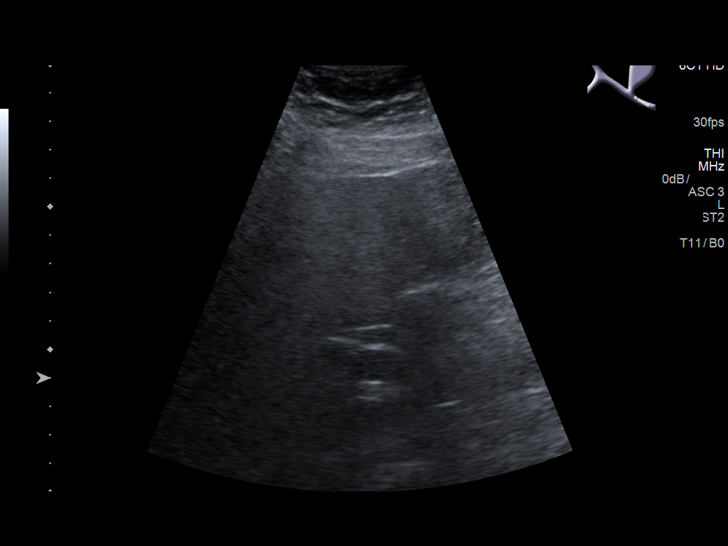
[im 13/49]
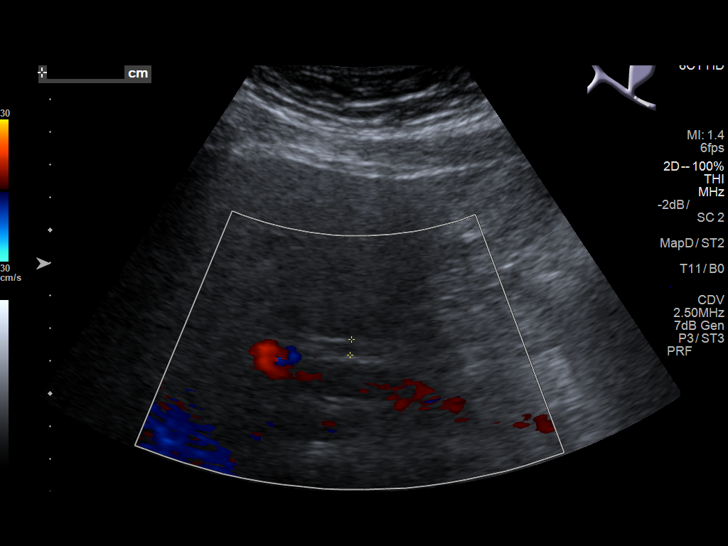
[im 17/49]
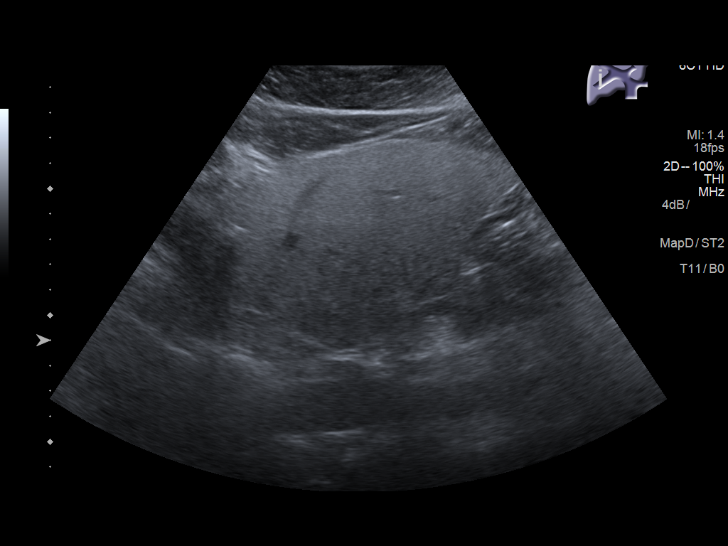
[im 19/49]
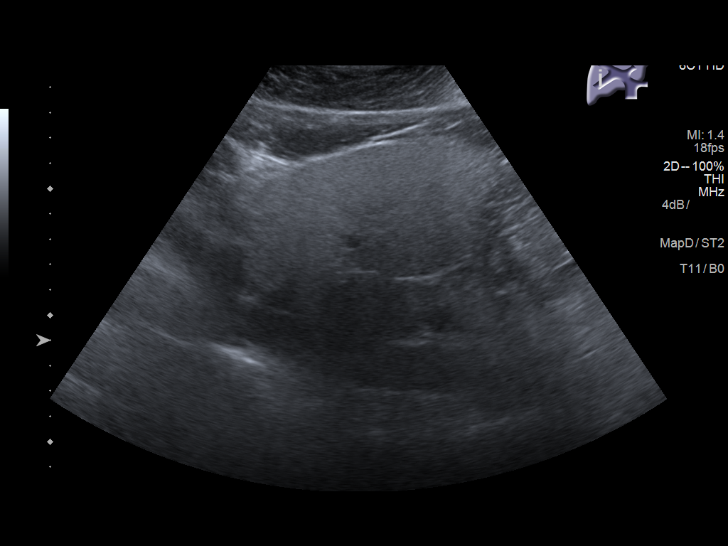
[im 23/49]
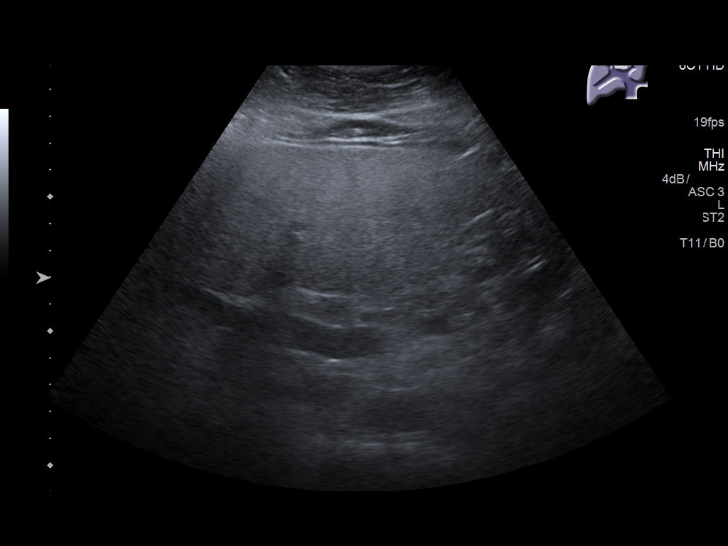
[im 27/49]
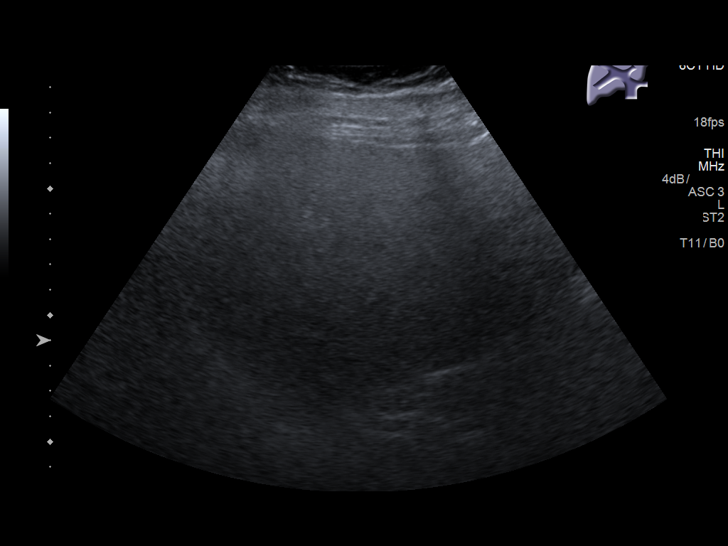
[im 31/49]
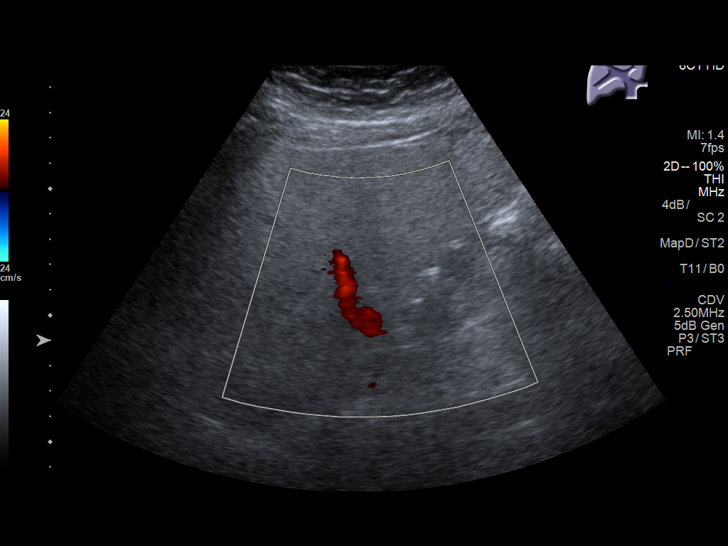
[im 33/49]
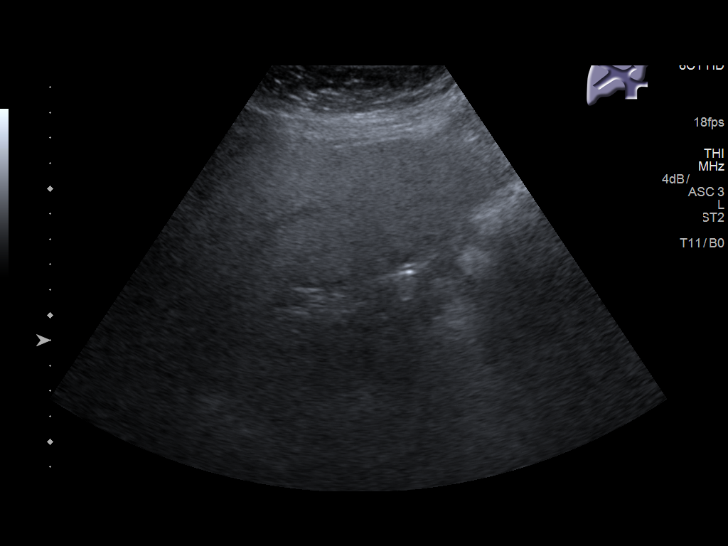
[im 37/49]
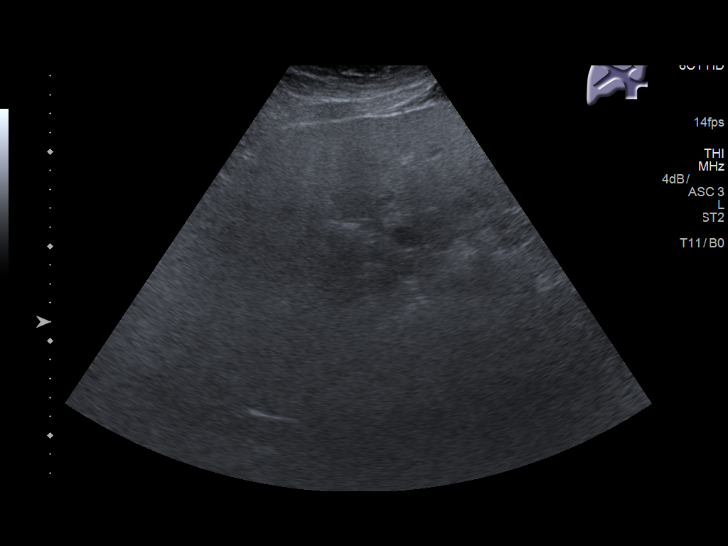
[im 41/49]
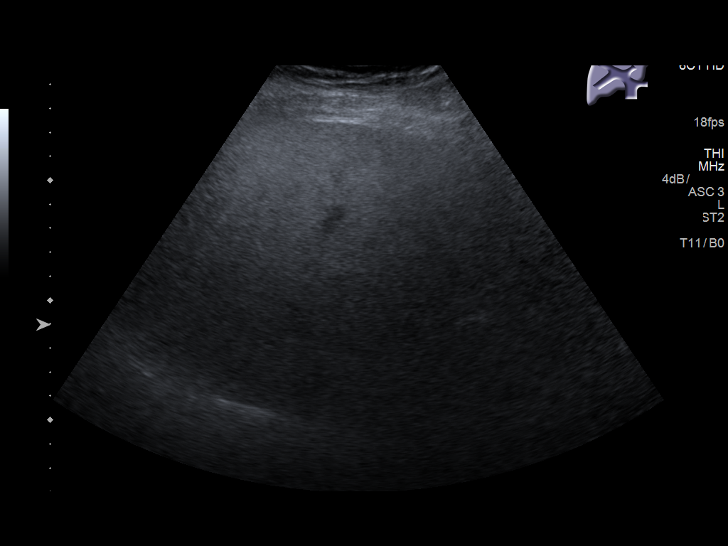
[im 45/49]
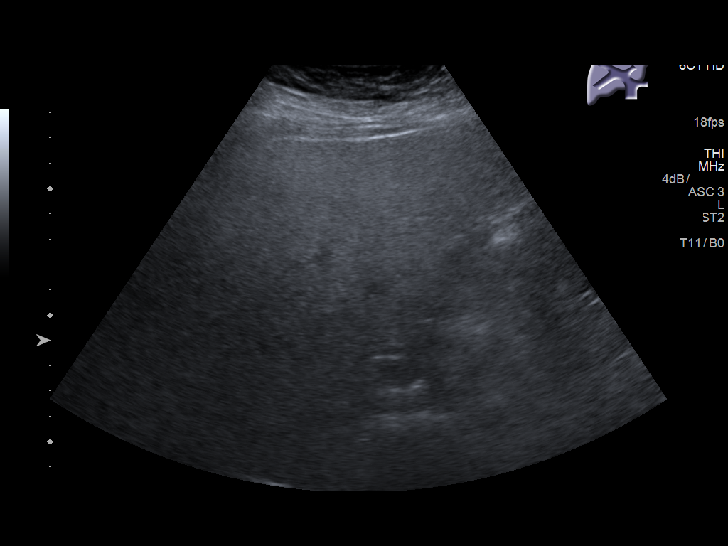
[im 49/49]
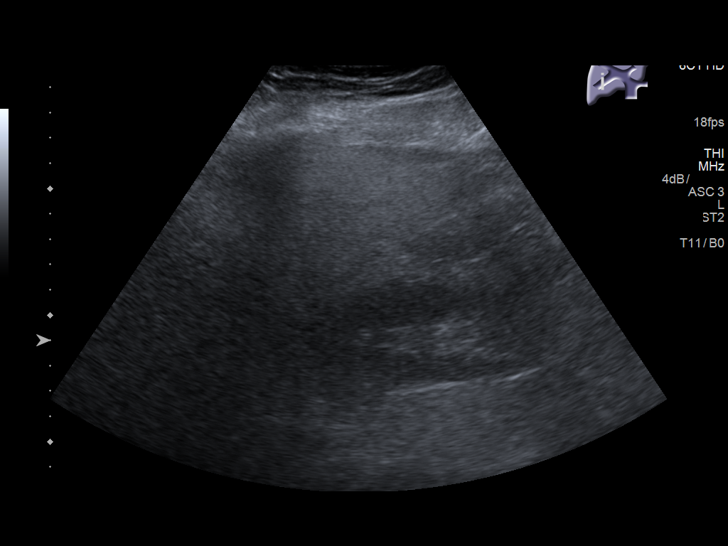

[14 of 25 positions shown; findings below may reference images not displayed]

FINDINGS: Gallbladder:

The gallbladder is surgically absent.

Common bile duct:

Diameter: 5.6 mm

Liver:

The hepatic echotexture is increased diffusely. There is no focal
mass nor ductal dilation. The surface contour is smooth. Portal vein
is patent on color Doppler imaging with normal direction of blood
flow towards the liver.
IMPRESSION: Increased hepatic echotexture compatible with fatty infiltrative
change. No acute abnormality of the liver or common bile duct.

Previous cholecystectomy.

## 2018-01-30 ENCOUNTER — Encounter: Payer: Self-pay | Admitting: Family Medicine

## 2018-02-08 NOTE — Telephone Encounter (Signed)
Lmov for patient to call and schedule °

## 2018-02-09 NOTE — Telephone Encounter (Signed)
Pt is scheduled 03/01/18  Nothing else needed

## 2018-02-23 ENCOUNTER — Encounter: Payer: Self-pay | Admitting: Internal Medicine

## 2018-03-01 ENCOUNTER — Encounter: Payer: Self-pay | Admitting: Pulmonary Disease

## 2018-03-01 ENCOUNTER — Ambulatory Visit: Payer: Managed Care, Other (non HMO) | Admitting: Pulmonary Disease

## 2018-03-01 VITALS — BP 124/74 | HR 85 | Ht 64.0 in | Wt 196.0 lb

## 2018-03-01 DIAGNOSIS — I10 Essential (primary) hypertension: Secondary | ICD-10-CM | POA: Diagnosis not present

## 2018-03-01 DIAGNOSIS — I272 Pulmonary hypertension, unspecified: Secondary | ICD-10-CM

## 2018-03-01 DIAGNOSIS — Z8709 Personal history of other diseases of the respiratory system: Secondary | ICD-10-CM

## 2018-03-01 NOTE — Progress Notes (Signed)
PULMONARY CONSULT NOTE  Requesting MD/Service: End Date of initial consultation: 03/01/18 Reason for consultation: Severe hypertension  PT PROFILE: 62 y.o. female former minimal smoker (quit 1988) who developed suddenly severe hypertension 08/2017 without clear explanation. Referred to R/O OSA  DATA: 12/08/17 Echocardiogram:  Normal except RVSP estimate 34 mmHg  INTERVAL:  HPI:  As above. She is generally asymptomatic and her BP has normalized. In fact, she is taking no medications for BP presently. She was having some problems with LE edema which has improved with discontinuation of amlodipine and initiation of qod furosemide.  She denies DH and her Epworth Scale score is zero.   She has a prior diagnosis of asthma but has not had any problems with DOE. She has been on no maintenance MDI for 18 months and does not require a rescue inhaler.  She denies CP, fever, purulent sputum, hemoptysis and calf tenderness   Past Medical History:  Diagnosis Date  . Allergy   . Asthma   . Asthma, mild intermittent, well-controlled 07/19/2015  . Benign Adrenal Adenoma    a. 09/2017 MRI Abd: 59mm L adrenal nodule, unchanged from 2013.  . Binge eating disorder   . 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 09/2014 Echo: Nl EF, mild TR/PR. Nl RV size/fxn; b. 11/2017 Echo: EF 60-65%, no rwma, nl RV fxn, PASP 32mmHg.  Marland Kitchen Fatty liver 08/24/2017   [place] Jan 2019  . History of chest pain    a. 02/2005 MV: EF 58%, fixed inf defect, likely attenuation; b. 06/2010 MV: EF 80%, no ischemia/scar. HTN response; c. 09/2014 MV: EF 69%. Report not available. D/c summary indicates it was a nl study.  . Hyperlipidemia   . Hypertension   . IFG (impaired fasting glucose)   . Insomnia   . Motion sickness    all moving things  . OCD (obsessive compulsive disorder)   . PONV (postoperative nausea and vomiting)   . Post-menopausal   . Wears contact lenses     Past  Surgical History:  Procedure Laterality Date  . BREAST BIOPSY Right    core bx- neg  . CHOLECYSTECTOMY  2013  . COLONOSCOPY  06/2012   small polyps, repeat in 06/2016  . COLONOSCOPY WITH PROPOFOL N/A 09/09/2017   Procedure: COLONOSCOPY WITH PROPOFOL;  Surgeon: Lucilla Lame, MD;  Location: [place];  Service: Endoscopy;  Laterality: N/A;  . CYSTECTOMY     uterine cyst removed  . CYSTECTOMY     Hyoid cyst removed  . MELANOMA EXCISION    . POLYPECTOMY  09/09/2017   Procedure: POLYPECTOMY;  Surgeon: Lucilla Lame, MD;  Location: [place];  Service: Endoscopy;;    MEDICATIONS: I have reviewed all medications and confirmed regimen as documented  Social History   Socioeconomic History  . Marital status: Divorced    Spouse name: Not on file  . Number of children: Not on file  . Years of education: Not on file  . Highest education level: Not on file  Occupational History  . Not on file  Social Needs  . Financial resource strain: Not on file  . Food insecurity:    Worry: Not on file    Inability: Not on file  . Transportation needs:    Medical: Not on file    Non-medical: Not on file  Tobacco Use  . Smoking status: Former Smoker    Years: 0.50    Types: Cigarettes  Last attempt to quit: 03/14/1987    Years since quitting: 31.0  . Smokeless tobacco: Never Used  Substance and Sexual Activity  . Alcohol use: No    Frequency: Never  . Drug use: No  . Sexual activity: Not Currently  Lifestyle  . Physical activity:    Days per week: Not on file    Minutes per session: Not on file  . Stress: Not on file  Relationships  . Social connections:    Talks on phone: Not on file    Gets together: Not on file    Attends religious service: Not on file    Active member of club or organization: Not on file    Attends meetings of clubs or organizations: Not on file    Relationship status: Not on file  . Intimate partner violence:    Fear of current or ex partner:  Not on file    Emotionally abused: Not on file    Physically abused: Not on file    Forced sexual activity: Not on file  Other Topics Concern  . Not on file  Social History Narrative  . Not on file    Family History  Problem Relation Age of Onset  . Hypertension Mother   . Heart disease Mother        pacemaker  . Congestive Heart Failure Mother        with pacemaker  . Stroke Mother        TIA/CVA  . Cancer Mother        lung  . Breast cancer Mother 35  . Cancer Father        colon and lung  . Hypertension Sister   . Mental illness Sister        anxiety  . Hypothyroidism Sister   . Heart disease Brother        unknown heart condition name, EF 55%  . Hypertension Brother   . Cancer Son        melanoma  . Diabetes Paternal Aunt   . Diabetes Paternal Uncle   . Hypertension Brother   . Heart Problems Son        prolonged QT syndrome (syncope)  . Stroke Maternal Grandmother   . COPD Maternal Aunt   . Cirrhosis Maternal Grandfather     ROS: No fever, myalgias/arthralgias, unexplained weight loss or weight gain No new focal weakness or sensory deficits No otalgia, hearing loss, visual changes, nasal and sinus symptoms, mouth and throat problems No neck pain or adenopathy No abdominal pain, N/V/D, diarrhea, change in bowel pattern No dysuria, change in urinary pattern   Vitals:   03/01/18 1548 03/01/18 1554  BP:  124/74  Pulse:  85  SpO2:  97%  Weight: 196 lb (88.9 kg)   Height: 5\' 4"  (1.626 m)   RA   EXAM:  Gen: WDWN, No overt respiratory distress HEENT: NCAT, sclera white, oropharynx normal Neck: Supple without LAN, thyromegaly, JVD Lungs: breath sounds full without adventitious sounds Cardiovascular: RRR, no murmurs noted Abdomen: Soft, nontender, normal BS Ext: without clubbing, cyanosis, edema Neuro: CNs grossly intact, motor and sensory intact Skin: Limited exam, no lesions noted  DATA:   BMP Latest Ref Rng & Units 12/09/2017 09/05/2017 08/29/2017   Glucose 65 - 99 mg/dL 97 100(H) 123(H)  BUN 6 - 24 mg/dL 9 11 13   Creatinine 0.57 - 1.00 mg/dL 0.81 0.75 0.84  BUN/Creat Ratio 9 - 23 11 15  -  Sodium 134 - 144  mmol/L 144 142 137  Potassium 3.5 - 5.2 mmol/L 4.4 4.1 4.0  Chloride 96 - 106 mmol/L 107(H) 102 102  CO2 20 - 29 mmol/L 21 22 22   Calcium 8.7 - 10.2 mg/dL 9.7 9.8 9.7    CBC Latest Ref Rng & Units 08/29/2017 12/08/2016 06/08/2016  WBC 3.6 - 11.0 K/uL 5.9 6.2 5.3  Hemoglobin 12.0 - 16.0 g/dL 15.4 13.7 13.5  Hematocrit 35.0 - 47.0 % 44.8 42.2 40.9  Platelets 150 - 440 K/uL 288 324 289    CXR 08/29/17: [facility]   I have personally reviewed all chest radiographs reported above including CXRs and CT chest unless otherwise indicated  IMPRESSION:     ICD-10-CM   1. History of asthma Z87.09 Pulmonary Function Test [facility] Only  2. Hypertension, unspecified type I10 Home sleep test  3. Mild pulmonary hypertension suggested by echocardiogram I27.20     PLAN:  Home PG ordered PFTs ordered No further eval for possible PAH @ this time If above tests are unrevealing, suggest repeat echocardiogram in 6 months from previous (October 2019) Follow up in 6 months  Merton Border, MD PCCM service Mobile [phone] Pager [phone] 03/11/2018 1:06 PM

## 2018-03-01 NOTE — Patient Instructions (Signed)
Home sleep study ordered  Pulmonary function tests ordered  Follow-up in 6 weeks

## 2018-03-02 ENCOUNTER — Encounter: Payer: Self-pay | Admitting: Family Medicine

## 2018-03-14 ENCOUNTER — Encounter: Payer: Self-pay | Admitting: Family Medicine

## 2018-04-05 ENCOUNTER — Telehealth: Payer: Self-pay | Admitting: Pulmonary Disease

## 2018-04-05 ENCOUNTER — Encounter: Payer: Self-pay | Admitting: Internal Medicine

## 2018-04-05 ENCOUNTER — Encounter: Payer: Self-pay | Admitting: Family Medicine

## 2018-04-05 ENCOUNTER — Other Ambulatory Visit: Payer: Self-pay

## 2018-04-05 ENCOUNTER — Other Ambulatory Visit: Payer: Self-pay | Admitting: Pulmonary Disease

## 2018-04-05 DIAGNOSIS — G4733 Obstructive sleep apnea (adult) (pediatric): Secondary | ICD-10-CM | POA: Diagnosis not present

## 2018-04-05 DIAGNOSIS — I10 Essential (primary) hypertension: Secondary | ICD-10-CM

## 2018-04-05 LAB — LIPID PANEL
Cholesterol: 204 — AB (ref 0–200)
HDL: 46 (ref 35–70)
LDL Cholesterol: 89
Triglycerides: 347 — AB (ref 40–160)

## 2018-04-05 LAB — BASIC METABOLIC PANEL
Creatinine: 0.8 (ref 0.5–1.1)
Glucose: 107

## 2018-04-05 LAB — HEMOGLOBIN A1C: Hemoglobin A1C: 5.9

## 2018-04-05 MED ORDER — FUROSEMIDE 40 MG PO TABS: 40.0000 mg | ORAL_TABLET | Freq: Every day | ORAL | 3 refills | Status: DC

## 2018-04-05 NOTE — Telephone Encounter (Signed)
Patient came by office and dropped off FMLA forms Patient signed release form and placed in inter office mail to [facility]

## 2018-04-06 ENCOUNTER — Ambulatory Visit: Payer: Managed Care, Other (non HMO) | Attending: Pulmonary Disease

## 2018-04-06 DIAGNOSIS — Z8709 Personal history of other diseases of the respiratory system: Secondary | ICD-10-CM | POA: Diagnosis present

## 2018-04-06 MED ORDER — ALBUTEROL SULFATE (2.5 MG/3ML) 0.083% IN NEBU
2.5000 mg | INHALATION_SOLUTION | Freq: Once | RESPIRATORY_TRACT | Status: AC
  Administered 2018-04-06: 2.5 mg via RESPIRATORY_TRACT
  Filled 2018-04-06: qty 3

## 2018-04-10 ENCOUNTER — Telehealth: Payer: Self-pay | Admitting: Pulmonary Disease

## 2018-04-10 DIAGNOSIS — G4733 Obstructive sleep apnea (adult) (pediatric): Secondary | ICD-10-CM

## 2018-04-10 NOTE — Telephone Encounter (Signed)
Pt is aware of results and willing to start CPAP treatment. Order has been placed to [facility] and aware DME will contact her.  Pt will contact our office once she receives CPAP to ensure 31-90 day follow up OV made.

## 2018-04-13 ENCOUNTER — Encounter: Payer: Self-pay | Admitting: Pulmonary Disease

## 2018-04-13 ENCOUNTER — Ambulatory Visit (INDEPENDENT_AMBULATORY_CARE_PROVIDER_SITE_OTHER): Payer: Managed Care, Other (non HMO) | Admitting: Pulmonary Disease

## 2018-04-13 VITALS — BP 122/70 | HR 89 | Ht 64.0 in | Wt 194.0 lb

## 2018-04-13 DIAGNOSIS — G4733 Obstructive sleep apnea (adult) (pediatric): Secondary | ICD-10-CM

## 2018-04-13 DIAGNOSIS — J4520 Mild intermittent asthma, uncomplicated: Secondary | ICD-10-CM | POA: Diagnosis not present

## 2018-04-13 MED ORDER — ALBUTEROL SULFATE HFA 108 (90 BASE) MCG/ACT IN AERS: 2.0000 | INHALATION_SPRAY | RESPIRATORY_TRACT | 3 refills | Status: DC | PRN

## 2018-04-13 MED ORDER — BUDESONIDE-FORMOTEROL FUMARATE 160-4.5 MCG/ACT IN AERO: 2.0000 | INHALATION_SPRAY | Freq: Two times a day (BID) | RESPIRATORY_TRACT | 11 refills | Status: DC

## 2018-04-13 NOTE — Patient Instructions (Signed)
Begin CPAP for sleep apnea  Continue Symbicort, 2 actuations twice a day as asthma controller medication  Continue albuterol inhaler as needed for increased shortness of breath, wheezing, cough, chest tightness  You may discontinue the Symbicort inhaler during those months or seasons when asthma is not a problem for you  Follow-up in 3 months.  Call sooner if needed

## 2018-04-17 ENCOUNTER — Encounter: Payer: Self-pay | Admitting: Pulmonary Disease

## 2018-04-17 NOTE — Progress Notes (Signed)
PULMONARY OFFICE FOLLOW UP NOTE  Requesting MD/Service: End Date of initial consultation: 03/01/18 Reason for consultation: Severe hypertension  PT PROFILE: 62 y.o. female former minimal smoker (quit 1988) who developed suddenly severe hypertension 08/2017 without clear explanation. Referred to R/O OSA. Also has history of asthma  DATA: 12/08/17 Echocardiogram:  Normal except RVSP estimate 34 mmHg 04/06/18 PFTs: FVC: 3.45 L (114 %pred), FEV1: 2.63 L (107 %pred), FEV1/FVC: 76% , TLC: 4.70 L (96 %pred), DLCO 79 %pred, DLCO/VA 112% pred. FEF 25-75% improved by 27% after BD therapy   INTERVAL: PSG has been performed but results have not been scanned into Epic. Autoset CPAP has been ordered but not initiated  SUBJ:  As above. Still awaiting initiation of CPAP. Re: asthma, she has noticed increased wheezing and SOB over past 2 wks attributed to increased heat and humidity. She is back on Symbicort with improvement. Denies CP, fever, purulent sputum, hemoptysis, LE edema and calf tenderness.   Vitals:   04/13/18 1445 04/13/18 1449  BP:  122/70  Pulse:  89  SpO2:  96%  Weight: 194 lb (88 kg)   Height: 5\' 4"  (1.626 m)   RA   EXAM:  Gen: NAD HEENT: NCAT, sclera white Neck: No JVD Lungs: breath sounds sl coarse, no wheezes or other adventitious sounds Cardiovascular: RRR, no murmurs Abdomen: Soft, nontender, normal BS Ext: without clubbing, cyanosis, edema Neuro: grossly intact Skin: Limited exam, no lesions noted   DATA:   BMP Latest Ref Rng & Units 12/09/2017 09/05/2017 08/29/2017  Glucose 65 - 99 mg/dL 97 100(H) 123(H)  BUN 6 - 24 mg/dL 9 11 13   Creatinine 0.57 - 1.00 mg/dL 0.81 0.75 0.84  BUN/Creat Ratio 9 - 23 11 15  -  Sodium 134 - 144 mmol/L 144 142 137  Potassium 3.5 - 5.2 mmol/L 4.4 4.1 4.0  Chloride 96 - 106 mmol/L 107(H) 102 102  CO2 20 - 29 mmol/L 21 22 22   Calcium 8.7 - 10.2 mg/dL 9.7 9.8 9.7    CBC Latest Ref Rng & Units 08/29/2017 12/08/2016 06/08/2016  WBC 3.6  - 11.0 K/uL 5.9 6.2 5.3  Hemoglobin 12.0 - 16.0 g/dL 15.4 13.7 13.5  Hematocrit 35.0 - 47.0 % 44.8 42.2 40.9  Platelets 150 - 440 K/uL 288 324 289    CXR: NNF  I have personally reviewed all chest radiographs reported above including CXRs and CT chest unless otherwise indicated  IMPRESSION:     ICD-10-CM   1. OSA (obstructive sleep apnea) G47.33   2. Mild intermittent asthma without complication V42.59     PLAN:  Begin CPAP for sleep apnea  Continue Symbicort, 2 actuations twice a day as asthma controller medication  Continue albuterol inhaler as needed for increased shortness of breath, wheezing, cough, chest tightness  She may discontinue the Symbicort inhaler during those months or seasons when asthma is not a problem  Follow-up in 3 months.  Call sooner if needed  Merton Border, MD PCCM service Mobile [phone] Pager [phone] 04/17/2018 10:31 PM

## 2018-04-24 NOTE — Telephone Encounter (Signed)
Received forms from [facility] for physician to review, placed in pulmonary inbox.

## 2018-04-24 NOTE — Telephone Encounter (Signed)
Folder placed in MD basket for signature once signed will be faxed back.

## 2018-04-28 NOTE — Telephone Encounter (Signed)
Still pending physician signature. He has been out of office this week.

## 2018-04-28 NOTE — Telephone Encounter (Signed)
[employer] calling to check status of forms for patient .

## 2018-04-28 NOTE — Telephone Encounter (Signed)
Notified [employer] as stated in previous message.

## 2018-05-03 ENCOUNTER — Encounter: Payer: Self-pay | Admitting: Family Medicine

## 2018-05-03 ENCOUNTER — Ambulatory Visit: Payer: Managed Care, Other (non HMO) | Admitting: Family Medicine

## 2018-05-03 VITALS — BP 122/80 | HR 76 | Temp 98.5°F | Ht 64.0 in | Wt 196.5 lb

## 2018-05-03 DIAGNOSIS — E249 Cushing's syndrome, unspecified: Secondary | ICD-10-CM | POA: Diagnosis not present

## 2018-05-03 DIAGNOSIS — Z1239 Encounter for other screening for malignant neoplasm of breast: Secondary | ICD-10-CM

## 2018-05-03 DIAGNOSIS — Z6833 Body mass index (BMI) 33.0-33.9, adult: Secondary | ICD-10-CM

## 2018-05-03 DIAGNOSIS — I2721 Secondary pulmonary arterial hypertension: Secondary | ICD-10-CM

## 2018-05-03 DIAGNOSIS — E6609 Other obesity due to excess calories: Secondary | ICD-10-CM | POA: Diagnosis not present

## 2018-05-03 DIAGNOSIS — Z1231 Encounter for screening mammogram for malignant neoplasm of breast: Secondary | ICD-10-CM

## 2018-05-03 DIAGNOSIS — Z23 Encounter for immunization: Secondary | ICD-10-CM

## 2018-05-03 NOTE — Assessment & Plan Note (Signed)
Managed by cardiologist 

## 2018-05-03 NOTE — Assessment & Plan Note (Signed)
Managed by endocrinologist; on [facility]; working with a case Freight forwarder

## 2018-05-03 NOTE — Progress Notes (Signed)
BP 122/80   Pulse 76   Temp 98.5 F (36.9 C) (Oral)   Ht 5\' 4"  (1.626 m)   Wt 196 lb 8 oz (89.1 kg)   SpO2 97%   BMI 33.73 kg/m    Subjective:    Patient ID: Julie Ayala, female    DOB: 1956-06-27, 62 y.o.   MRN: [id number]  HPI: Julie Ayala is a 62 y.o. female  Chief Complaint  Patient presents with  . Obesity    discuss weight    HPI Here for paperwork for her work's demand on meeting BMI limit  Cushing's Syndrome: See endocrinology- Dr. Carmela Rima, started on korlym 8 days ago. States initially had some hypertension with it but states did some relaxation techniques and blood pressure decreased over the initial few days. It is only distributed by a particular program, took 8 weeks for prior auth, $15000 treatment; suppresses cortisol and other hormones; could feel some symptoms like fight or flight stuff; feeling transitioning down; had a few days of elevated BP; watching for uterine bleeding as a side effect potential, knows that is a risk and will watch; went through menopause at age 85; patient know wonders if her sister has Cushing's syndrome  Sleep Apnea: sees pulmonary, wears CPAP  Pulmonary Hypertension: mild, follows up with cardiology. Using lasix once a week to control fluid in legs; started spironolactone with the Korlym too; controlling things  Binge eating: states beginning of year weighed 210 and had lost 26 pounds but has started to gain some weight back. Wants to get some feedback on how to continue to loose weight and prevent weight gain.   High TG and low HDL; Dr. Ronnald Collum did a metabolic panel with him, every two weeks now; reviewed labs brought by patient; she was not fasting, glucose 107, TG 347; A1c 5.9 which is prediabetes  A group of them at work has started to use the gym at work; full gym with shower; 20 minutes in the AM and 20 mintues in the PM; also walking for 20 minutes at lunch in the parking lot; always taking stairs; referral to weight and  wellness  She decided to decline bariatric surgery  Wt Readings from Last 3 Encounters:  05/03/18 196 lb 8 oz (89.1 kg)  04/13/18 194 lb (88 kg)  03/01/18 196 lb (88.9 kg)   BP Readings from Last 3 Encounters:  05/03/18 122/80  04/13/18 122/70  03/01/18 124/74    Depression screen PHQ 2/9 05/03/2018 01/02/2018 08/25/2017 07/19/2017 05/23/2017  Decreased Interest 0 0 0 0 0  Down, Depressed, Hopeless 0 0 0 0 0  PHQ - 2 Score 0 0 0 0 0  Altered sleeping 0 - - - -  Tired, decreased energy 0 - - - -  Change in appetite 0 - - - -  Feeling bad or failure about yourself  0 - - - -  Trouble concentrating 0 - - - -  Moving slowly or fidgety/restless 0 - - - -  Suicidal thoughts 0 - - - -  PHQ-9 Score 0 - - - -  Difficult doing work/chores Not difficult at all - - - -    Relevant past medical, surgical, family and social history reviewed Past Medical History:  Diagnosis Date  . Allergy   . Asthma   . Asthma, mild intermittent, well-controlled 07/19/2015  . Benign Adrenal Adenoma    a. 09/2017 MRI Abd: 56mm L adrenal nodule, unchanged from 2013.  . Binge eating disorder   .  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 09/2014 Echo: Nl EF, mild TR/PR. Nl RV size/fxn; b. 11/2017 Echo: EF 60-65%, no rwma, nl RV fxn, PASP 61mmHg.  Marland Kitchen Fatty liver 08/24/2017   [place] Jan 2019  . History of chest pain    a. 02/2005 MV: EF 58%, fixed inf defect, likely attenuation; b. 06/2010 MV: EF 80%, no ischemia/scar. HTN response; c. 09/2014 MV: EF 69%. Report not available. D/c summary indicates it was a nl study.  . Hyperlipidemia   . Hypertension   . IFG (impaired fasting glucose)   . Insomnia   . Motion sickness    all moving things  . OCD (obsessive compulsive disorder)   . PONV (postoperative nausea and vomiting)   . Post-menopausal   . Wears contact lenses    Past Surgical History:  Procedure Laterality Date  . BREAST BIOPSY Right    core bx- neg  .  CHOLECYSTECTOMY  2013  . COLONOSCOPY  06/2012   small polyps, repeat in 06/2016  . COLONOSCOPY WITH PROPOFOL N/A 09/09/2017   Procedure: COLONOSCOPY WITH PROPOFOL;  Surgeon: Lucilla Lame, MD;  Location: [place];  Service: Endoscopy;  Laterality: N/A;  . CYSTECTOMY     uterine cyst removed  . CYSTECTOMY     Hyoid cyst removed  . MELANOMA EXCISION    . POLYPECTOMY  09/09/2017   Procedure: POLYPECTOMY;  Surgeon: Lucilla Lame, MD;  Location: [place];  Service: Endoscopy;;   Family History  Problem Relation Age of Onset  . Hypertension Mother   . Heart disease Mother        pacemaker  . Congestive Heart Failure Mother        with pacemaker  . Stroke Mother        TIA/CVA  . Cancer Mother        lung  . Breast cancer Mother 69  . Cancer Father        colon and lung  . Hypertension Sister   . Mental illness Sister        anxiety  . Hypothyroidism Sister   . Heart disease Brother        unknown heart condition name, EF 55%  . Hypertension Brother   . Cancer Son        melanoma  . Diabetes Paternal Aunt   . Diabetes Paternal Uncle   . Hypertension Brother   . Heart Problems Son        prolonged QT syndrome (syncope)  . Stroke Maternal Grandmother   . COPD Maternal Aunt   . Cirrhosis Maternal Grandfather    Social History   Tobacco Use  . Smoking status: Former Smoker    Years: 0.50    Types: Cigarettes    Last attempt to quit: 03/14/1987    Years since quitting: 31.1  . Smokeless tobacco: Never Used  Substance Use Topics  . Alcohol use: No    Frequency: Never  . Drug use: No    Interim medical history since last visit reviewed. Allergies and medications reviewed  Review of Systems Per HPI unless specifically indicated above     Objective:    BP 122/80   Pulse 76   Temp 98.5 F (36.9 C) (Oral)   Ht 5\' 4"  (1.626 m)   Wt 196 lb 8 oz (89.1 kg)   SpO2 97%   BMI 33.73 kg/m   Wt Readings from Last  3 Encounters:  05/03/18 196 lb 8 oz  (89.1 kg)  04/13/18 194 lb (88 kg)  03/01/18 196 lb (88.9 kg)    Physical Exam  Constitutional: She appears well-developed and well-nourished. No distress.  HENT:  Head: Normocephalic and atraumatic.  Eyes: EOM are normal. No scleral icterus.  Neck: No thyromegaly present.  Cardiovascular: Normal rate, regular rhythm and normal heart sounds.  No murmur heard. Pulmonary/Chest: Effort normal and breath sounds normal. No respiratory distress. She has no wheezes.  Abdominal: Soft. She exhibits no distension.  Musculoskeletal: She exhibits no edema.  Neurological: She is alert.  Skin: Skin is warm and dry. She is not diaphoretic. No pallor.  Psychiatric: She has a normal mood and affect. Her behavior is normal. Judgment and thought content normal.    Results for orders placed or performed in visit on 01/02/18  Lipid panel  Result Value Ref Range   Cholesterol, Total 223 (H) 100 - 199 mg/dL   Triglycerides 233 (H) 0 - 149 mg/dL   HDL 45 >39 mg/dL   VLDL Cholesterol Cal 47 (H) 5 - 40 mg/dL   LDL Calculated 131 (H) 0 - 99 mg/dL   Chol/HDL Ratio 5.0 (H) 0.0 - 4.4 ratio  Hepatic function panel  Result Value Ref Range   Total Protein 6.6 6.0 - 8.5 g/dL   Albumin 4.6 3.5 - 5.5 g/dL   Bilirubin Total 0.3 0.0 - 1.2 mg/dL   Bilirubin, Direct 0.11 0.00 - 0.40 mg/dL   Alkaline Phosphatase 87 39 - 117 IU/L   AST 20 0 - 40 IU/L   ALT 29 0 - 32 IU/L      Assessment & Plan:   Problem List Items Addressed This Visit      Cardiovascular and Mediastinum   PAH (pulmonary artery hypertension) ([place]) (Chronic)    Managed by cardiologist      Relevant Medications   spironolactone (ALDACTONE) 50 MG tablet     Endocrine   Cushing syndrome ([place]) - Primary (Chronic)    Managed by endocrinologist; on [place]; working with a case manager        Other   Obesity    We will use activity instead of this BMI number for her health and wellness goals; see form      Relevant Medications    miFEPRIStone (KORLYM) 300 MG TABS    Other Visit Diagnoses    Need for Tdap vaccination       Relevant Orders   Tdap vaccine greater than or equal to 7yo IM (Completed)   Need for influenza vaccination       Relevant Orders   Flu Vaccine QUAD 6+ mos PF IM (Fluarix Quad PF) (Completed)   Screening for breast cancer       Relevant Orders   MM 3D SCREEN BREAST BILATERAL       Follow up plan: Return in about 6 months (around 11/01/2018).  An after-visit summary was printed and given to the patient at [place].  Please see the patient instructions which may contain other information and recommendations beyond what is mentioned above in the assessment and plan.  No orders of the defined types were placed in this encounter.   Orders Placed This Encounter  Procedures  . MM 3D SCREEN BREAST BILATERAL  . Flu Vaccine QUAD 6+ mos PF IM (Fluarix Quad PF)  . Tdap vaccine greater than or equal to 7yo IM

## 2018-05-03 NOTE — Patient Instructions (Addendum)
Let us know where you would like to be referred for the weight and wellness program Keep up the great job you are doing

## 2018-05-03 NOTE — Assessment & Plan Note (Signed)
We will use activity instead of this BMI number for her health and wellness goals; see form

## 2018-05-04 ENCOUNTER — Telehealth: Payer: Self-pay

## 2018-05-04 NOTE — Telephone Encounter (Signed)
Received paperwork for FMLA needing doctors signature. Dr. Alva Garnet signed and faxed back to [facility]).

## 2018-05-10 ENCOUNTER — Telehealth: Payer: Self-pay

## 2018-05-10 NOTE — Telephone Encounter (Signed)
Spoke to [employer] Quintella Reichert) re: paperwork for [employer]. It was signed as requested but not completed. Notified [place] Dr. Alva Garnet will not be in clinic till next week and will have him complete it then. Nothing further needed at this time.

## 2018-06-30 ENCOUNTER — Encounter: Payer: Self-pay | Admitting: Family Medicine

## 2018-06-30 ENCOUNTER — Ambulatory Visit: Payer: Managed Care, Other (non HMO) | Admitting: Family Medicine

## 2018-06-30 ENCOUNTER — Telehealth: Payer: Self-pay | Admitting: Family Medicine

## 2018-06-30 VITALS — BP 118/66 | HR 72 | Temp 98.6°F | Ht 64.0 in | Wt 210.6 lb

## 2018-06-30 DIAGNOSIS — R7301 Impaired fasting glucose: Secondary | ICD-10-CM

## 2018-06-30 DIAGNOSIS — I10 Essential (primary) hypertension: Secondary | ICD-10-CM | POA: Diagnosis not present

## 2018-06-30 DIAGNOSIS — I2721 Secondary pulmonary arterial hypertension: Secondary | ICD-10-CM | POA: Diagnosis not present

## 2018-06-30 DIAGNOSIS — M67471 Ganglion, right ankle and foot: Secondary | ICD-10-CM

## 2018-06-30 DIAGNOSIS — J45998 Other asthma: Secondary | ICD-10-CM | POA: Diagnosis not present

## 2018-06-30 NOTE — Progress Notes (Signed)
BP 118/66   Pulse 72   Temp 98.6 F (37 C) (Oral)   Ht 5\' 4"  (1.626 m)   Wt 210 lb 9.6 oz (95.5 kg)   SpO2 96%   BMI 36.15 kg/m    Subjective:    Patient ID: Julie Ayala, female    DOB: August 06, 1956, 62 y.o.   MRN: [id number]  HPI: Julie Ayala is a 62 y.o. female  Chief Complaint  Patient presents with  . surgical clearance    right foot gangleion cyst    HPI Patient is here to get surgical clearance ganglion cyst right foot Surgical date is December 23rd Dr. Sabra Heck believes the general anesthesia part will last 15 minutes Embedded in ligaments, has had two injections since the summer and it has gotten smaller She saw cardiologist and the pulmonary HTN was so slight that he was reticent to even call it pulmonary hypertension she says Asthma; seeing Dr. Alva Garnet; patient has been using Symbicort twice a day every day for the last six weeks; using rescue inhaler about every other day, mostly when air is cold coming in to work Prediabetes; Dr. Ronnald Collum checked her A1c in the last month; I am requesting those results today, but do not have access to the results at this time Total cholesterol shot up 30 points on the [facility]   Depression screen [facility] 2/9 06/30/2018 05/03/2018 01/02/2018 08/25/2017 07/19/2017  Decreased Interest 0 0 0 0 0  Down, Depressed, Hopeless 0 0 0 0 0  PHQ - 2 Score 0 0 0 0 0  Altered sleeping 0 0 - - -  Tired, decreased energy 0 0 - - -  Change in appetite 0 0 - - -  Feeling bad or failure about yourself  0 0 - - -  Trouble concentrating 0 0 - - -  Moving slowly or fidgety/restless 0 0 - - -  Suicidal thoughts 0 0 - - -  PHQ-9 Score 0 0 - - -  Difficult doing work/chores Not difficult at all Not difficult at all - - -   Fall Risk  06/30/2018 05/03/2018 01/02/2018 08/25/2017 07/19/2017  Falls in the past year? 0 No No No Yes  Number falls in past yr: 0 - - - 1  Injury with Fall? - - - - No  Comment - - - - -    Relevant past medical, surgical, family  and social history reviewed Past Medical History:  Diagnosis Date  . Allergy   . Asthma   . Asthma, mild intermittent, well-controlled 07/19/2015  . Benign Adrenal Adenoma    a. 09/2017 MRI Abd: 5mm L adrenal nodule, unchanged from 2013.  . Binge eating disorder   . 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 09/2014 Echo: Nl EF, mild TR/PR. Nl RV size/fxn; b. 11/2017 Echo: EF 60-65%, no rwma, nl RV fxn, PASP 42mmHg.  Marland Kitchen Fatty liver 08/24/2017   [place] Jan 2019  . History of chest pain    a. 02/2005 MV: EF 58%, fixed inf defect, likely attenuation; b. 06/2010 MV: EF 80%, no ischemia/scar. HTN response; c. 09/2014 MV: EF 69%. Report not available. D/c summary indicates it was a nl study.  . Hyperlipidemia   . Hypertension   . IFG (impaired fasting glucose)   . Insomnia   . Motion sickness    all moving things  . OCD (obsessive compulsive disorder)   . PONV (postoperative nausea and  vomiting)   . Post-menopausal   . Wears contact lenses    Past Surgical History:  Procedure Laterality Date  . BREAST BIOPSY Right    core bx- neg  . CHOLECYSTECTOMY  2013  . COLONOSCOPY  06/2012   small polyps, repeat in 06/2016  . COLONOSCOPY WITH PROPOFOL N/A 09/09/2017   Procedure: COLONOSCOPY WITH PROPOFOL;  Surgeon: Lucilla Lame, MD;  Location: [place];  Service: Endoscopy;  Laterality: N/A;  . CYSTECTOMY     uterine cyst removed  . CYSTECTOMY     Hyoid cyst removed  . MELANOMA EXCISION    . POLYPECTOMY  09/09/2017   Procedure: POLYPECTOMY;  Surgeon: Lucilla Lame, MD;  Location: [place];  Service: Endoscopy;;   Family History  Problem Relation Age of Onset  . Hypertension Mother   . Heart disease Mother        pacemaker  . Congestive Heart Failure Mother        with pacemaker  . Stroke Mother        TIA/CVA  . Cancer Mother        lung  . Breast cancer Mother 84  . Cancer Father        colon and lung  . Hypertension  Sister   . Mental illness Sister        anxiety  . Hypothyroidism Sister   . Heart disease Brother        unknown heart condition name, EF 55%  . Hypertension Brother   . Cancer Son        melanoma  . Diabetes Paternal Aunt   . Diabetes Paternal Uncle   . Hypertension Brother   . Heart Problems Son        prolonged QT syndrome (syncope)  . Stroke Maternal Grandmother   . COPD Maternal Aunt   . Cirrhosis Maternal Grandfather    Social History   Tobacco Use  . Smoking status: Former Smoker    Years: 0.50    Types: Cigarettes    Last attempt to quit: 03/14/1987    Years since quitting: 31.3  . Smokeless tobacco: Never Used  Substance Use Topics  . Alcohol use: No    Frequency: Never  . Drug use: No     Office Visit from 06/30/2018 in [facility]  AUDIT-C Score  1      Interim medical history since last visit reviewed. Allergies and medications reviewed  Review of Systems  Constitutional: Positive for unexpected weight change. Negative for fever.  Respiratory: Positive for wheezing (having to use inhalers a few times a day for the last several weeks (Symbicort); still wheezing with the Symbicort).   Cardiovascular: Positive for leg swelling (has seen cardiologist, also side effect of Korlym). Negative for chest pain and palpitations.  Gastrointestinal: Negative for blood in stool, nausea and vomiting.  Endocrine: Negative for polydipsia and polyuria.  Genitourinary: Negative for dysuria and frequency.  Musculoskeletal:       Right foot pain  Skin: Positive for rash (after Korlym, resolves, medicine-related).       No hx of MRSA  Hematological: Negative for adenopathy. Does not bruise/bleed easily.   Per HPI unless specifically indicated above     Objective:    BP 118/66   Pulse 72   Temp 98.6 F (37 C) (Oral)   Ht 5\' 4"  (1.626 m)   Wt 210 lb 9.6 oz (95.5 kg)   SpO2 96%   BMI 36.15 kg/m  Wt Readings from Last 3 Encounters:  06/30/18  210 lb 9.6 oz (95.5 kg)  05/03/18 196 lb 8 oz (89.1 kg)  04/13/18 194 lb (88 kg)    Physical Exam  Constitutional: She appears well-developed and well-nourished. No distress.  HENT:  Head: Normocephalic and atraumatic.  Eyes: EOM are normal. No scleral icterus.  Neck: No thyromegaly present.  Cardiovascular: Normal rate, regular rhythm and normal heart sounds.  No murmur heard. Pulmonary/Chest: Effort normal and breath sounds normal. No respiratory distress. She has no wheezes.  Abdominal: Soft. Bowel sounds are normal. She exhibits no distension.  Musculoskeletal: She exhibits no edema.       Right foot: There is swelling (dorsum of RIGHT foot). There is normal range of motion and no tenderness.       Feet:  Neurological: She is alert.  Skin: Skin is warm and dry. She is not diaphoretic. No pallor.  Psychiatric: She has a normal mood and affect. Her behavior is normal. Judgment and thought content normal.    Results for orders placed or performed in visit on 01/02/18  Lipid panel  Result Value Ref Range   Cholesterol, Total 223 (H) 100 - 199 mg/dL   Triglycerides 233 (H) 0 - 149 mg/dL   HDL 45 >39 mg/dL   VLDL Cholesterol Cal 47 (H) 5 - 40 mg/dL   LDL Calculated 131 (H) 0 - 99 mg/dL   Chol/HDL Ratio 5.0 (H) 0.0 - 4.4 ratio  Hepatic function panel  Result Value Ref Range   Total Protein 6.6 6.0 - 8.5 g/dL   Albumin 4.6 3.5 - 5.5 g/dL   Bilirubin Total 0.3 0.0 - 1.2 mg/dL   Bilirubin, Direct 0.11 0.00 - 0.40 mg/dL   Alkaline Phosphatase 87 39 - 117 IU/L   AST 20 0 - 40 IU/L   ALT 29 0 - 32 IU/L      Assessment & Plan:   Problem List Items Addressed This Visit      Cardiovascular and Mediastinum   PAH (pulmonary artery hypertension) ([place]) (Chronic)    Very mild, cardiologist saw her      Essential hypertension, benign (Chronic)    Controlled today        Respiratory   Asthma, persistent not controlled    Recommend that she seek pulmonary clearance prior to  being put under general anesthesia; patient will schedule her own appointment with Dr. Alva Garnet        Endocrine   IFG (impaired fasting glucose) (Chronic)    Monitored by Dr. Ronnald Collum; request results today       Other Visit Diagnoses    Ganglion cyst of right foot    -  Primary   patient is seen today but her surgery is actually more than 30 days from today's visit; recommended pulmonary clearance within 30 days of anticipated surgery      Follow up plan: No follow-ups on file.  An after-visit summary was printed and given to the patient at [facility].  Please see the patient instructions which may contain other information and recommendations beyond what is mentioned above in the assessment and plan.  cc: Dr. Sabra Heck, [facility]

## 2018-06-30 NOTE — Assessment & Plan Note (Signed)
Recommend that she seek pulmonary clearance prior to being put under general anesthesia; patient will schedule her own appointment with Dr. Alva Garnet

## 2018-06-30 NOTE — Assessment & Plan Note (Signed)
Controlled today 

## 2018-06-30 NOTE — Assessment & Plan Note (Signed)
Monitored by Dr. Ronnald Collum; request results today

## 2018-06-30 NOTE — Assessment & Plan Note (Signed)
Very mild, cardiologist saw her

## 2018-06-30 NOTE — Telephone Encounter (Signed)
Faxed

## 2018-06-30 NOTE — Telephone Encounter (Signed)
Please send a copy of today's note to Dr. Sabra Heck, [facility]

## 2018-06-30 NOTE — Patient Instructions (Addendum)
We'll request records from Dr. Ronnald Collum Schedule an appointment to see Dr. Alva Garnet on or after November 24th to get pulmonary clearance for your upcoming surgery

## 2018-07-07 ENCOUNTER — Ambulatory Visit: Payer: Managed Care, Other (non HMO) | Admitting: Pulmonary Disease

## 2018-07-11 ENCOUNTER — Ambulatory Visit (INDEPENDENT_AMBULATORY_CARE_PROVIDER_SITE_OTHER): Payer: Managed Care, Other (non HMO) | Admitting: Pulmonary Disease

## 2018-07-11 ENCOUNTER — Encounter: Payer: Self-pay | Admitting: Pulmonary Disease

## 2018-07-11 VITALS — BP 132/80 | HR 82 | Ht 64.0 in | Wt 211.8 lb

## 2018-07-11 DIAGNOSIS — Z9989 Dependence on other enabling machines and devices: Secondary | ICD-10-CM | POA: Diagnosis not present

## 2018-07-11 DIAGNOSIS — Z01811 Encounter for preprocedural respiratory examination: Secondary | ICD-10-CM

## 2018-07-11 DIAGNOSIS — G4733 Obstructive sleep apnea (adult) (pediatric): Secondary | ICD-10-CM | POA: Diagnosis not present

## 2018-07-11 DIAGNOSIS — J4540 Moderate persistent asthma, uncomplicated: Secondary | ICD-10-CM | POA: Diagnosis not present

## 2018-07-11 NOTE — Patient Instructions (Signed)
Continue CPAP with sleep Continue Symbicort inhaler Continue albuterol inhaler as needed Okay to proceed with surgery for removal of ganglion cyst on foot as scheduled Follow-up in 6 months.  Call sooner if needed

## 2018-07-12 ENCOUNTER — Ambulatory Visit (INDEPENDENT_AMBULATORY_CARE_PROVIDER_SITE_OTHER): Payer: Managed Care, Other (non HMO) | Admitting: Internal Medicine

## 2018-07-12 ENCOUNTER — Encounter: Payer: Self-pay | Admitting: Internal Medicine

## 2018-07-12 VITALS — BP 128/76 | HR 70 | Ht 64.0 in | Wt 209.2 lb

## 2018-07-12 DIAGNOSIS — E782 Mixed hyperlipidemia: Secondary | ICD-10-CM

## 2018-07-12 DIAGNOSIS — I10 Essential (primary) hypertension: Secondary | ICD-10-CM | POA: Diagnosis not present

## 2018-07-12 DIAGNOSIS — R600 Localized edema: Secondary | ICD-10-CM

## 2018-07-12 NOTE — Progress Notes (Signed)
Follow-up Outpatient Visit Date: 07/12/2018  Primary Care Provider: Arnetha Courser, MD [address] [place] [address]  Chief Complaint: Follow-up leg edema  HPI:  Julie Ayala is a 62 y.o. year-old female with history of hypertension, hyperlipidemia, borderline diabetes, and asthma, who presents for follow-up of leg swelling.  She was last seen in our office in June.  When I am initially met her in late April, we suspected that her leg edema may be due to high-dose amlodipine.  After discontinuation of amlodipine and continued use of furosemide, lower extremity edema resolved.  Sleep study was recommended to evaluate borderline pulmonary hypertension.  Subsequent sleep study through Dr. Alva Garnet revealed severe sleep apnea.  She is now using CPAP.  She recently began taking mifepristone for management of benign adrenal adenoma.  She is also on spironolactone, which is monitored by her endocrinologist.  Today, Julie Ayala reports that she is feeling well.  She denies significant edema, as well as shortness of breath (other than with asthma flares), palpitations, lightheadedness, and chest pain.  She feels like she may be sleeping a bit better with CPAP, though she otherwise feels about the same.   --------------------------------------------------------------------------------------------------  Cardiovascular History & Procedures: Cardiovascular Problems:  Atypical chest pain  Leg swelling  Risk Factors:  Hypertension, hyperlipidemia, borderline diabetes mellitus, and obesity  Cath/PCI:  None  CV Surgery:  None  EP Procedures and Devices:  None  Non-Invasive Evaluation(s):  Echocardiogram (12/08/2017): Normal LV size.  LVEF 60 to 65% with normal wall motion and diastolic function.  Normal left atrial size.  Normal RV size and function.  Borderline elevated PA pressure (34 mmHg).  Renal artery Doppler (09/01/2017): No evidence of hemodynamically significant  renal artery stenosis.  Normal sonographic appearance of the kidneys.  Incidentally noted hepatic steatosis.  Echocardiogram (10/01/14): Normal LVEF. Mild TR/PR. Normal RV size and function.  Myocardial perfusion stress test (09/2014): Formal report not available. Discharge summary notes study "within normal limits" with LVEF 69%.  Exercise myocardial perfusion stress test (06/25/10): Normal study without ischemia or scar. LVEF 80%. Hypertensive blood pressure response.  Exercise myocardial perfusion stress test (02/18/05): Low risk study with fixed inferior defect likely representing attenuation artifact. No ischemia. LVEF 58%.  Recent CV Pertinent Labs: Lab Results  Component Value Date   CHOL 223 (H) 01/10/2018   HDL 45 01/10/2018   LDLCALC 131 (H) 01/10/2018   TRIG 233 (H) 01/10/2018   CHOLHDL 5.0 (H) 01/10/2018   K 4.4 12/09/2017   BUN 9 12/09/2017   CREATININE 0.81 12/09/2017    Past medical and surgical history were reviewed and updated in EPIC.  Current Meds  Medication Sig  . albuterol (PROVENTIL HFA;VENTOLIN HFA) 108 (90 Base) MCG/ACT inhaler Inhale 2 puffs into the lungs every 4 (four) hours as needed for wheezing or shortness of breath.  Marland Kitchen aspirin EC 81 MG tablet Take 1 tablet (81 mg total) by mouth daily.  . budesonide-formoterol (SYMBICORT) 160-4.5 MCG/ACT inhaler Inhale 2 puffs into the lungs 2 (two) times daily.  Marland Kitchen ezetimibe (ZETIA) 10 MG tablet Take 1 tablet (10 mg total) by mouth daily.  . furosemide (LASIX) 40 MG tablet Take 1 tablet (40 mg total) by mouth daily.  . miFEPRIStone (KORLYM) 300 MG TABS Take 1 tablet by mouth daily.  Marland Kitchen spironolactone (ALDACTONE) 50 MG tablet Take 50 mg by mouth 2 (two) times daily.    Allergies: Codeine; Pravastatin; Latex; and Sulfa antibiotics  Social History   Tobacco Use  .  Smoking status: Former Smoker    Years: 0.50    Types: Cigarettes    Last attempt to quit: 03/14/1987    Years since quitting: 31.3  . Smokeless  tobacco: Never Used  Substance Use Topics  . Alcohol use: No    Frequency: Never  . Drug use: No    Family History  Problem Relation Age of Onset  . Hypertension Mother   . Heart disease Mother        pacemaker  . Congestive Heart Failure Mother        with pacemaker  . Stroke Mother        TIA/CVA  . Cancer Mother        lung  . Breast cancer Mother 33  . Cancer Father        colon and lung  . Hypertension Sister   . Mental illness Sister        anxiety  . Hypothyroidism Sister   . Heart disease Brother        unknown heart condition name, EF 55%  . Hypertension Brother   . Cancer Son        melanoma  . Diabetes Paternal Aunt   . Diabetes Paternal Uncle   . Hypertension Brother   . Heart Problems Son        prolonged QT syndrome (syncope)  . Stroke Maternal Grandmother   . COPD Maternal Aunt   . Cirrhosis Maternal Grandfather     Review of Systems: A 12-system review of systems was performed and was negative except as noted in the HPI.  --------------------------------------------------------------------------------------------------  Physical Exam: BP 128/76 (BP Location: Left Arm, Patient Position: Sitting, Cuff Size: Large)   Pulse 70   Ht '5\' 4"'$  (1.626 m)   Wt 209 lb 4 oz (94.9 kg)   BMI 35.92 kg/m   General:  NAD HEENT: No conjunctival pallor or scleral icterus. Moist mucous membranes.  OP clear. Neck: Supple without lymphadenopathy, thyromegaly, JVD, or HJR. Lungs: Normal work of breathing. Clear to auscultation bilaterally without wheezes or crackles. Heart: Regular rate and rhythm without murmurs, rubs, or gallops. Non-displaced PMI. Abd: Bowel sounds present. Soft, NT/ND without hepatosplenomegaly Ext: No lower extremity edema. Radial, PT, and DP pulses are 2+ bilaterally. Skin: Warm and dry without rash.  EKG:  NSR with low voltage and left axis deviation.  Lab Results  Component Value Date   WBC 5.9 08/29/2017   HGB 15.4 08/29/2017   HCT  44.8 08/29/2017   MCV 86.1 08/29/2017   PLT 288 08/29/2017    Lab Results  Component Value Date   NA 144 12/09/2017   K 4.4 12/09/2017   CL 107 (H) 12/09/2017   CO2 21 12/09/2017   BUN 9 12/09/2017   CREATININE 0.81 12/09/2017   GLUCOSE 97 12/09/2017   ALT 29 01/10/2018    Lab Results  Component Value Date   CHOL 223 (H) 01/10/2018   HDL 45 01/10/2018   LDLCALC 131 (H) 01/10/2018   TRIG 233 (H) 01/10/2018   CHOLHDL 5.0 (H) 01/10/2018    --------------------------------------------------------------------------------------------------  ASSESSMENT AND PLAN: Leg edema Resolved after discontinuation of amlodipine.  Continue current doses of furosemide and spironolactone.  Renal function and electrolytes are being closely followed by endocrinology.  Continue CPAP and follow-up with Dr. Alva Garnet.  Hypertension BP at goal.  No medication changes.  Hyperlipidemia Given statin intolerance, continue ezetimibe.  Further follow-up and management per Dr. Sanda Klein and endocrinology.  Follow-up: Return to clinic as  needed.  Nelva Bush, MD 07/12/2018 3:52 PM

## 2018-07-12 NOTE — Patient Instructions (Signed)
Medication Instructions:  Your physician recommends that you continue on your current medications as directed. Please refer to the Current Medication list given to you today.  If you need a refill on your cardiac medications before your next appointment, please call your pharmacy.   Lab work: NONE If you have labs (blood work) drawn today and your tests are completely normal, you will receive your results only by: Marland Kitchen MyChart Message (if you have MyChart) OR . A paper copy in the mail If you have any lab test that is abnormal or we need to change your treatment, we will call you to review the results.  Testing/Procedures: NONE  Follow-Up: At [facility], you and your health needs are our priority.  As part of our continuing mission to provide you with exceptional heart care, we have created designated Provider Care Teams.  These Care Teams include your primary Cardiologist (physician) and Advanced Practice Providers (APPs -  Physician Assistants and Nurse Practitioners) who all work together to provide you with the care you need, when you need it. You will need a follow up appointment ON AS NEEDED BASIS.

## 2018-07-17 ENCOUNTER — Encounter: Payer: Self-pay | Admitting: Pulmonary Disease

## 2018-07-17 NOTE — Progress Notes (Signed)
PULMONARY OFFICE FOLLOW UP NOTE  Requesting MD/Service: End Date of initial consultation: 03/01/18 Reason for consultation: Severe hypertension  PT PROFILE: 62 y.o. female former minimal smoker (quit 1988) who developed suddenly severe hypertension 08/2017 without clear explanation. Referred to R/O OSA. Also has history of asthma  DATA: 12/08/17 Echocardiogram:  Normal except RVSP estimate 34 mmHg 04/06/18 PFTs: FVC: 3.45 L (114 %pred), FEV1: 2.63 L (107 %pred), FEV1/FVC: 76% , TLC: 4.70 L (96 %pred), DLCO 79 %pred, DLCO/VA 112% pred. FEF 25-75% improved by 27% after BD therapy 10/27-11/25/19 CPAP compliance: Usage 28/30 days.  Median pressure 9.7 cm H2O.  AHI 9.6/hour.   INTERVAL: Last seen 04/13/2018.  Since then, CPAP has been initiated.  No major pulmonary events  SUBJ:  This is a scheduled follow-up.  She is using CPAP consistently and tolerating it well.  She believes that she is benefiting it and notes "deeper sleep".  Her blood pressure control is much improved.  With regard to sleep apnea, she has no new complaints.  With regard to asthma, she remains on her Symbicort inhaler.  She uses her albuterol inhaler at most 1 time per day.  She feels that her asthma is well controlled.  Her weight is increased substantially which she attributes to dietary indiscretion during this holiday season.  She is scheduled 12/23 for removal of a ganglion cyst from her right foot.  This will reportedly be a brief procedure under general anesthesia.   Vitals:   07/11/18 1548 07/11/18 1552  BP:  132/80  Pulse:  82  SpO2:  97%  Weight: 211 lb 12.8 oz (96.1 kg)   Height: 5\' 4"  (1.626 m)   RA   EXAM:  Gen: Obese, NAD HEENT: NCAT, sclera white Neck: No JVD Lungs: breath sounds full, no wheezes or other adventitious sounds Cardiovascular: RRR, no murmurs Abdomen: Soft, nontender, normal BS Ext: without clubbing, cyanosis, edema Neuro: grossly intact Skin: Limited exam, no lesions noted     DATA:   BMP Latest Ref Rng & Units 12/09/2017 09/05/2017 08/29/2017  Glucose 65 - 99 mg/dL 97 100(H) 123(H)  BUN 6 - 24 mg/dL 9 11 13   Creatinine 0.57 - 1.00 mg/dL 0.81 0.75 0.84  BUN/Creat Ratio 9 - 23 11 15  -  Sodium 134 - 144 mmol/L 144 142 137  Potassium 3.5 - 5.2 mmol/L 4.4 4.1 4.0  Chloride 96 - 106 mmol/L 107(H) 102 102  CO2 20 - 29 mmol/L 21 22 22   Calcium 8.7 - 10.2 mg/dL 9.7 9.8 9.7    CBC Latest Ref Rng & Units 08/29/2017 12/08/2016 06/08/2016  WBC 3.6 - 11.0 K/uL 5.9 6.2 5.3  Hemoglobin 12.0 - 16.0 g/dL 15.4 13.7 13.5  Hematocrit 35.0 - 47.0 % 44.8 42.2 40.9  Platelets 150 - 440 K/uL 288 324 289    CXR: NNF  I have personally reviewed all chest radiographs reported above including CXRs and CT chest unless otherwise indicated  IMPRESSION:     ICD-10-CM   1. OSA on CPAP G47.33    Z99.89   2. Moderate persistent asthma without complication Z36.64   3. Preop pulmonary/respiratory exam Z01.811     PLAN:  -Continue CPAP AutoSet with sleep -Continue Symbicort 160-4.5, 2 actuations twice daily.  Rinse mouth after use -Continue albuterol inhaler as needed -As long as she remains stable between now and the scheduled procedure, the procedure may proceed as planned with no special precautions -Follow-up in 6 months.  Call sooner if needed  Merton Border, MD  PCCM service Mobile [phone] Pager [phone] 07/17/2018 12:37 PM

## 2018-07-26 ENCOUNTER — Other Ambulatory Visit: Payer: Self-pay | Admitting: Specialist

## 2018-07-27 ENCOUNTER — Encounter
Admission: RE | Admit: 2018-07-27 | Discharge: 2018-07-27 | Disposition: A | Discharge status: Home / Self Care | Payer: Managed Care, Other (non HMO) | Source: Ambulatory Visit | Attending: Specialist | Admitting: Specialist

## 2018-07-27 ENCOUNTER — Other Ambulatory Visit: Payer: Self-pay

## 2018-07-27 DIAGNOSIS — Z01812 Encounter for preprocedural laboratory examination: Secondary | ICD-10-CM | POA: Insufficient documentation

## 2018-07-27 HISTORY — DX: Localized edema: R60.0

## 2018-07-27 MED ORDER — CHLORHEXIDINE GLUCONATE CLOTH 2 % EX PADS
6.0000 | MEDICATED_PAD | Freq: Once | CUTANEOUS | Status: DC
  Filled 2018-07-27: qty 6

## 2018-07-27 NOTE — Patient Instructions (Signed)
Your procedure is scheduled on: 08/07/18 Mon Report to Same Day Surgery 2nd floor medical mall [facility] Entrance-take elevator on left to 2nd floor.  Check in with surgery information desk.) To find out your arrival time please call [phone] between 1PM - 3PM on 08/04/18 Fri  Remember: Instructions that are not followed completely may result in serious medical risk, up to and including death, or upon the discretion of your surgeon and anesthesiologist your surgery may need to be rescheduled.    _x___ 1. Do not eat food after midnight the night before your procedure. You may drink clear liquids up to 2 hours before you are scheduled to arrive at the hospital for your procedure.  Do not drink clear liquids within 2 hours of your scheduled arrival to the hospital.  Clear liquids include  --Water or Apple juice without pulp  --Clear carbohydrate beverage such as ClearFast or Gatorade  --Black Coffee or Clear Tea (No milk, no creamers, do not add anything to                  the coffee or Tea Type 1 and type 2 diabetics should only drink water.   ____Ensure clear carbohydrate drink on the way to the hospital for bariatric patients  ____Ensure clear carbohydrate drink 3 hours before surgery for Dr Dwyane Luo patients if physician instructed.   No gum chewing or hard candies.     __x__ 2. No Alcohol for 24 hours before or after surgery.   __x__3. No Smoking or e-cigarettes for 24 prior to surgery.  Do not use any chewable tobacco products for at least 6 hour prior to surgery   ____  4. Bring all medications with you on the day of surgery if instructed.    __x__ 5. Notify your doctor if there is any change in your medical condition     (cold, fever, infections).    x___6. On the morning of surgery brush your teeth with toothpaste and water.  You may rinse your mouth with mouth wash if you wish.  Do not swallow any toothpaste or mouthwash.   Do not wear jewelry, make-up, hairpins,  clips or nail polish.  Do not wear lotions, powders, or perfumes. You may wear deodorant.  Do not shave 48 hours prior to surgery. Men may shave face and neck.  Do not bring valuables to the hospital.    [facility] is not responsible for any belongings or valuables.               Contacts, dentures or bridgework may not be worn into surgery.  Leave your suitcase in the car. After surgery it may be brought to your room.  For patients admitted to the hospital, discharge time is determined by your                       treatment team.  _  Patients discharged the day of surgery will not be allowed to drive home.  You will need someone to drive you home and stay with you the night of your procedure.    Please read over the following fact sheets that you were given:   [facility] Preparing for Surgery and or MRSA Information   _x___ Take anti-hypertensive listed below, cardiac, seizure, asthma,     anti-reflux and psychiatric medicines. These include:  1. albuterol (PROVENTIL HFA;VENTOLIN HFA) 108 (90 Base) MCG/ACT inhaler  2.budesonide-formoterol (SYMBICORT) 160-4.5 MCG/ACT inhaler  3.miFEPRIStone (KORLYM) 300  MG TABS  4.  5.  6.  ____Fleets enema or Magnesium Citrate as directed.   _x___ Use CHG Soap or sage wipes as directed on instruction sheet   ____ Use inhalers on the day of surgery and bring to hospital day of surgery  ____ Stop Metformin and Janumet 2 days prior to surgery.    ____ Take 1/2 of usual insulin dose the night before surgery and none on the morning     surgery.   _x___ Follow recommendations from Cardiologist, Pulmonologist or PCP regarding          stopping Aspirin, Coumadin, Plavix ,Eliquis, Effient, or Pradaxa, and Pletal.  X____Stop Anti-inflammatories such as Advil, Aleve, Ibuprofen, Motrin, Naproxen, Naprosyn, Goodies powders or aspirin products. OK to take Tylenol and                          Celebrex.   _x___ Stop supplements until after surgery.  But may  continue Vitamin D, Vitamin B,       and multivitamin.   ____ Bring C-Pap to the hospital.

## 2018-08-04 ENCOUNTER — Ambulatory Visit: Payer: Self-pay | Admitting: Nurse Practitioner

## 2018-08-07 ENCOUNTER — Ambulatory Visit: Payer: Managed Care, Other (non HMO) | Admitting: Anesthesiology

## 2018-08-07 ENCOUNTER — Other Ambulatory Visit: Payer: Self-pay

## 2018-08-07 ENCOUNTER — Encounter: Payer: Self-pay | Admitting: Emergency Medicine

## 2018-08-07 ENCOUNTER — Ambulatory Visit
Admission: RE | Admit: 2018-08-07 | Discharge: 2018-08-07 | Disposition: A | Discharge status: Home / Self Care | Payer: Managed Care, Other (non HMO) | Source: Ambulatory Visit | Attending: Specialist | Admitting: Specialist

## 2018-08-07 ENCOUNTER — Encounter
Admission: RE | Disposition: A | Discharge status: Home / Self Care | Payer: Self-pay | Source: Ambulatory Visit | Attending: Specialist

## 2018-08-07 DIAGNOSIS — I10 Essential (primary) hypertension: Secondary | ICD-10-CM | POA: Diagnosis not present

## 2018-08-07 DIAGNOSIS — M67471 Ganglion, right ankle and foot: Secondary | ICD-10-CM | POA: Diagnosis not present

## 2018-08-07 DIAGNOSIS — Z87891 Personal history of nicotine dependence: Secondary | ICD-10-CM | POA: Insufficient documentation

## 2018-08-07 HISTORY — PX: GANGLION CYST EXCISION: SHX1691

## 2018-08-07 LAB — CBC
HCT: 38.8 % (ref 36.0–46.0)
Hemoglobin: 13.2 g/dL (ref 12.0–15.0)
MCH: 29.9 pg (ref 26.0–34.0)
MCHC: 34 g/dL (ref 30.0–36.0)
MCV: 88 fL (ref 80.0–100.0)
Platelets: 296 10*3/uL (ref 150–400)
RBC: 4.41 MIL/uL (ref 3.87–5.11)
RDW: 12.8 % (ref 11.5–15.5)
WBC: 6 10*3/uL (ref 4.0–10.5)
nRBC: 0 % (ref 0.0–0.2)

## 2018-08-07 SURGERY — EXCISION, GANGLION CYST, FOOT
Anesthesia: General | Site: Foot | Laterality: Right

## 2018-08-07 MED ORDER — FENTANYL CITRATE (PF) 100 MCG/2ML IJ SOLN
INTRAMUSCULAR | Status: DC | PRN
  Administered 2018-08-07: 50 ug via INTRAVENOUS
  Administered 2018-08-07 (×2): 25 ug via INTRAVENOUS

## 2018-08-07 MED ORDER — BUPIVACAINE HCL (PF) 0.5 % IJ SOLN
INTRAMUSCULAR | Status: AC
  Filled 2018-08-07: qty 30

## 2018-08-07 MED ORDER — DEXAMETHASONE SODIUM PHOSPHATE 10 MG/ML IJ SOLN
INTRAMUSCULAR | Status: AC
  Filled 2018-08-07: qty 1

## 2018-08-07 MED ORDER — FENTANYL CITRATE (PF) 100 MCG/2ML IJ SOLN: 25.0000 ug | INTRAMUSCULAR | Status: DC | PRN

## 2018-08-07 MED ORDER — LIDOCAINE HCL (PF) 2 % IJ SOLN
INTRAMUSCULAR | Status: AC
  Filled 2018-08-07: qty 10

## 2018-08-07 MED ORDER — PROPOFOL 500 MG/50ML IV EMUL
INTRAVENOUS | Status: DC | PRN
  Administered 2018-08-07: 120 ug/kg/min via INTRAVENOUS

## 2018-08-07 MED ORDER — DEXAMETHASONE SODIUM PHOSPHATE 10 MG/ML IJ SOLN
INTRAMUSCULAR | Status: DC | PRN
  Administered 2018-08-07: 5 mg via INTRAVENOUS

## 2018-08-07 MED ORDER — PROPOFOL 10 MG/ML IV BOLUS
INTRAVENOUS | Status: DC | PRN
  Administered 2018-08-07: 150 mg via INTRAVENOUS

## 2018-08-07 MED ORDER — ONDANSETRON HCL 4 MG/2ML IJ SOLN
INTRAMUSCULAR | Status: DC | PRN
  Administered 2018-08-07: 4 mg via INTRAVENOUS

## 2018-08-07 MED ORDER — LACTATED RINGERS IV SOLN
INTRAVENOUS | Status: DC
  Administered 2018-08-07: 08:00:00 via INTRAVENOUS

## 2018-08-07 MED ORDER — PROPOFOL 10 MG/ML IV BOLUS
INTRAVENOUS | Status: AC
  Filled 2018-08-07: qty 20

## 2018-08-07 MED ORDER — ACETAMINOPHEN 10 MG/ML IV SOLN
INTRAVENOUS | Status: AC
  Filled 2018-08-07: qty 100

## 2018-08-07 MED ORDER — GABAPENTIN 400 MG PO CAPS: 400.0000 mg | ORAL_CAPSULE | Freq: Three times a day (TID) | ORAL | 3 refills | Status: DC

## 2018-08-07 MED ORDER — LIDOCAINE HCL (CARDIAC) PF 100 MG/5ML IV SOSY
PREFILLED_SYRINGE | INTRAVENOUS | Status: DC | PRN
  Administered 2018-08-07: 80 mg via INTRAVENOUS
  Administered 2018-08-07: 20 mg via INTRAVENOUS

## 2018-08-07 MED ORDER — FAMOTIDINE 20 MG PO TABS
20.0000 mg | ORAL_TABLET | Freq: Once | ORAL | Status: AC
  Administered 2018-08-07: 20 mg via ORAL

## 2018-08-07 MED ORDER — ONDANSETRON HCL 4 MG/2ML IJ SOLN
INTRAMUSCULAR | Status: AC
  Filled 2018-08-07: qty 2

## 2018-08-07 MED ORDER — MIDAZOLAM HCL 2 MG/2ML IJ SOLN
INTRAMUSCULAR | Status: DC | PRN
  Administered 2018-08-07: 2 mg via INTRAVENOUS

## 2018-08-07 MED ORDER — HYDROCODONE-ACETAMINOPHEN 5-325 MG PO TABS: 1.0000 | ORAL_TABLET | Freq: Four times a day (QID) | ORAL | 0 refills | Status: DC | PRN

## 2018-08-07 MED ORDER — MELOXICAM 7.5 MG PO TABS
ORAL_TABLET | ORAL | Status: AC
  Administered 2018-08-07: 15 mg via ORAL
  Filled 2018-08-07: qty 2

## 2018-08-07 MED ORDER — MELOXICAM 15 MG PO TABS: 15.0000 mg | ORAL_TABLET | Freq: Every day | ORAL | 3 refills | Status: DC

## 2018-08-07 MED ORDER — CEFAZOLIN SODIUM-DEXTROSE 2-4 GM/100ML-% IV SOLN
INTRAVENOUS | Status: AC
  Filled 2018-08-07: qty 100

## 2018-08-07 MED ORDER — CLINDAMYCIN PHOSPHATE 600 MG/50ML IV SOLN
600.0000 mg | INTRAVENOUS | Status: AC
  Administered 2018-08-07: 600 mg via INTRAVENOUS

## 2018-08-07 MED ORDER — BUPIVACAINE HCL 0.5 % IJ SOLN
INTRAMUSCULAR | Status: DC | PRN
  Administered 2018-08-07: 15 mL

## 2018-08-07 MED ORDER — DIPHENHYDRAMINE HCL 50 MG/ML IJ SOLN
INTRAMUSCULAR | Status: DC | PRN
  Administered 2018-08-07: 25 mg via INTRAVENOUS

## 2018-08-07 MED ORDER — GABAPENTIN 300 MG PO CAPS
ORAL_CAPSULE | ORAL | Status: AC
  Administered 2018-08-07: 300 mg via ORAL
  Filled 2018-08-07: qty 1

## 2018-08-07 MED ORDER — MIDAZOLAM HCL 2 MG/2ML IJ SOLN
INTRAMUSCULAR | Status: AC
  Filled 2018-08-07: qty 2

## 2018-08-07 MED ORDER — MELOXICAM 7.5 MG PO TABS
15.0000 mg | ORAL_TABLET | ORAL | Status: AC
  Administered 2018-08-07: 15 mg via ORAL

## 2018-08-07 MED ORDER — ACETAMINOPHEN 10 MG/ML IV SOLN
INTRAVENOUS | Status: DC | PRN
  Administered 2018-08-07: 1000 mg via INTRAVENOUS

## 2018-08-07 MED ORDER — FAMOTIDINE 20 MG PO TABS
ORAL_TABLET | ORAL | Status: AC
  Administered 2018-08-07: 20 mg via ORAL
  Filled 2018-08-07: qty 1

## 2018-08-07 MED ORDER — FENTANYL CITRATE (PF) 100 MCG/2ML IJ SOLN
INTRAMUSCULAR | Status: AC
  Filled 2018-08-07: qty 2

## 2018-08-07 MED ORDER — CEFAZOLIN SODIUM-DEXTROSE 2-4 GM/100ML-% IV SOLN
2.0000 g | INTRAVENOUS | Status: AC
  Administered 2018-08-07: 2 g via INTRAVENOUS

## 2018-08-07 MED ORDER — GABAPENTIN 300 MG PO CAPS
300.0000 mg | ORAL_CAPSULE | ORAL | Status: AC
  Administered 2018-08-07: 300 mg via ORAL

## 2018-08-07 MED ORDER — ONDANSETRON HCL 4 MG/2ML IJ SOLN: 4.0000 mg | Freq: Once | INTRAMUSCULAR | Status: DC | PRN

## 2018-08-07 MED ORDER — CLINDAMYCIN PHOSPHATE 600 MG/50ML IV SOLN
INTRAVENOUS | Status: AC
  Filled 2018-08-07: qty 50

## 2018-08-07 MED ORDER — DIPHENHYDRAMINE HCL 50 MG/ML IJ SOLN
INTRAMUSCULAR | Status: AC
  Filled 2018-08-07: qty 1

## 2018-08-07 SURGICAL SUPPLY — 37 items
BLADE SURG MINI STRL (BLADE) ×3 IMPLANT
BNDG ESMARK 4X12 TAN STRL LF (GAUZE/BANDAGES/DRESSINGS) ×3 IMPLANT
CANISTER SUCT 1200ML W/VALVE (MISCELLANEOUS) ×3 IMPLANT
CHLORAPREP W/TINT 26ML (MISCELLANEOUS) ×3 IMPLANT
COVER WAND RF STERILE (DRAPES) IMPLANT
CUFF TOURN 18 STER (MISCELLANEOUS) IMPLANT
CUFF TOURN 30 N/S (MISCELLANEOUS) ×3 IMPLANT
DRSG GAUZE FLUFF 36X18 (GAUZE/BANDAGES/DRESSINGS) ×3 IMPLANT
ELECT REM PT RETURN 9FT ADLT (ELECTROSURGICAL) ×3
ELECTRODE REM PT RTRN 9FT ADLT (ELECTROSURGICAL) ×1 IMPLANT
GAUZE PETRO XEROFOAM 1X8 (MISCELLANEOUS) ×3 IMPLANT
GAUZE SPONGE 4X4 12PLY STRL (GAUZE/BANDAGES/DRESSINGS) ×3 IMPLANT
GLOVE SURG ORTHO 8.0 STRL STRW (GLOVE) ×3 IMPLANT
GOWN STRL REUS W/ TWL LRG LVL3 (GOWN DISPOSABLE) ×1 IMPLANT
GOWN STRL REUS W/TWL LRG LVL3 (GOWN DISPOSABLE) ×2
GOWN STRL REUS W/TWL LRG LVL4 (GOWN DISPOSABLE) ×3 IMPLANT
KIT TURNOVER KIT A (KITS) IMPLANT
NS IRRIG 500ML POUR BTL (IV SOLUTION) ×3 IMPLANT
PACK EXTREMITY ARMC (MISCELLANEOUS) ×3 IMPLANT
PAD CAST CTTN 4X4 STRL (SOFTGOODS) IMPLANT
PAD PREP 24X41 OB/GYN DISP (PERSONAL CARE ITEMS) ×3 IMPLANT
PADDING CAST 6X4YD NS (MISCELLANEOUS) ×2
PADDING CAST COTTON 4X4 STRL (SOFTGOODS)
PADDING CAST COTTON 6X4 NS (MISCELLANEOUS) ×1 IMPLANT
SPLINT CAST 1 STEP 3X12 (MISCELLANEOUS) IMPLANT
SPLINT CAST 1 STEP 4X15 (MISCELLANEOUS) ×3 IMPLANT
STOCKINETTE BIAS CUT 4 980044 (GAUZE/BANDAGES/DRESSINGS) ×3 IMPLANT
STOCKINETTE BIAS CUT 6 980064 (GAUZE/BANDAGES/DRESSINGS) ×3 IMPLANT
STOCKINETTE STRL 4IN 9604848 (GAUZE/BANDAGES/DRESSINGS) IMPLANT
STRAP SAFETY 5IN WIDE (MISCELLANEOUS) ×3 IMPLANT
SUT ETHILON 4-0 (SUTURE) ×2
SUT ETHILON 4-0 FS2 18XMFL BLK (SUTURE) ×1
SUT ETHILON 5-0 (SUTURE) ×2
SUT ETHILON 5-0 C-3 18XMFL BLK (SUTURE) ×1
SUT VIC AB 4-0 FS2 27 (SUTURE) ×3 IMPLANT
SUTURE ETHLN 4-0 FS2 18XMF BLK (SUTURE) ×1 IMPLANT
SUTURE ETHLN 5-0 C3 18XMF BLK (SUTURE) ×1 IMPLANT

## 2018-08-07 NOTE — Anesthesia Procedure Notes (Signed)
Procedure Name: LMA Insertion Date/Time: 08/07/2018 7:48 AM Performed by: Jonna Clark, CRNA Pre-anesthesia Checklist: Patient identified, Patient being monitored, Timeout performed, Emergency Drugs available and Suction available Patient Re-evaluated:Patient Re-evaluated prior to induction Oxygen Delivery Method: Circle system utilized Preoxygenation: Pre-oxygenation with 100% oxygen Induction Type: IV induction Ventilation: Mask ventilation without difficulty LMA: LMA inserted LMA Size: 3.5 Tube type: Oral Number of attempts: 1 Placement Confirmation: positive ETCO2 and breath sounds checked- equal and bilateral Tube secured with: Tape Dental Injury: Teeth and Oropharynx as per pre-operative assessment

## 2018-08-07 NOTE — Transfer of Care (Signed)
Immediate Anesthesia Transfer of Care Note  Patient: Health visitor  Procedure(s) Performed: Procedure(s): REMOVAL GANGLION CYST FOOT (Right)  Patient Location: PACU  Anesthesia Type:General  Level of Consciousness: sedated  Airway & Oxygen Therapy: Patient Spontanous Breathing and Patient connected to face mask oxygen  Post-op Assessment: Report given to RN and Post -op Vital signs reviewed and stable  Post vital signs: Reviewed and stable  Last Vitals:  Vitals:   08/07/18 0621 08/07/18 0845  BP: 132/70 92/60  Pulse: 76 73  Resp: 17 11  Temp: 36.8 C 36.7 C  SpO2: 67% 01%    Complications: No apparent anesthesia complications

## 2018-08-07 NOTE — Discharge Instructions (Signed)
AMBULATORY SURGERY  DISCHARGE INSTRUCTIONS   1) The drugs that you were given will stay in your system until tomorrow so for the next 24 hours you should not:  A) Drive an automobile B) Make any legal decisions C) Drink any alcoholic beverage   2) You may resume regular meals tomorrow.  Today it is better to start with liquids and gradually work up to solid foods.  You may eat anything you prefer, but it is better to start with liquids, then soup and crackers, and gradually work up to solid foods.   3) Please notify your doctor immediately if you have any unusual bleeding, trouble breathing, redness and pain at the surgery site, drainage, fever, or pain not relieved by medication.    4) Additional Instructions:  Elevation and ice for comfort as needed  Please contact your physician with any problems or Same Day Surgery at [phone], Monday through Friday 6 am to 4 pm, or [place] at [facility] number at [phone].

## 2018-08-07 NOTE — Anesthesia Preprocedure Evaluation (Signed)
Anesthesia Evaluation  Patient identified by MRN, date of birth, ID band Patient awake    Reviewed: Allergy & Precautions, H&P , NPO status , Patient's Chart, lab work & pertinent test results, reviewed documented beta blocker date and time   History of Anesthesia Complications (+) PONV and history of anesthetic complications  Airway Mallampati: II  TM Distance: >3 FB Neck ROM: full    Dental  (+) Teeth Intact   Pulmonary asthma , former smoker,    Pulmonary exam normal        Cardiovascular Exercise Tolerance: Good hypertension, negative cardio ROS Normal cardiovascular exam Rate:Normal     Neuro/Psych PSYCHIATRIC DISORDERS Anxiety  Neuromuscular disease    GI/Hepatic negative GI ROS, Neg liver ROS,   Endo/Other  negative endocrine ROS  Renal/GU negative Renal ROS  negative genitourinary   Musculoskeletal   Abdominal   Peds  Hematology negative hematology ROS (+)   Anesthesia Other Findings   Reproductive/Obstetrics negative OB ROS                             Anesthesia Physical Anesthesia Plan  ASA: II  Anesthesia Plan: General LMA   Post-op Pain Management:    Induction:   PONV Risk Score and Plan:   Airway Management Planned:   Additional Equipment:   Intra-op Plan:   Post-operative Plan:   Informed Consent: I have reviewed the patients History and Physical, chart, labs and discussed the procedure including the risks, benefits and alternatives for the proposed anesthesia with the patient or authorized representative who has indicated his/her understanding and acceptance.     Plan Discussed with: CRNA  Anesthesia Plan Comments:         Anesthesia Quick Evaluation

## 2018-08-07 NOTE — Op Note (Signed)
  08/07/2018  8:39 AM  PATIENT:  Julie Ayala  62 y.o. female  PRE-OPERATIVE DIAGNOSIS:  ganglion of right foot  POST-OPERATIVE DIAGNOSIS:  Same  PROCEDURE:  REMOVAL GANGLION CYST FOOT  SURGEON:  Shanara Schnieders E Oddie Kuhlmann MD  ASST:    ANESTHESIA:   General  EBL: None  TOURNIQUET TIME:  24 min  OPERATIVE FINDINGS:  Large deep ganglion cyst with bloody fluid  OPERATIVE PROCEDURE:  The patient was brought to the operating room and placed in supine position on the operating room table.  The operative leg was prepped and draped in a sterile fashion.  Tourniquet was inflated above the ankle to 300 mmHg.  A transverse incision was made over the dorsum of the midfoot.  Dissection was carried out bluntly using loupe magnification around the large cystic ganglion.  Extensor tendons were retracted medially and laterally.  The ganglion was followed down to the midfoot.  Fluid was aspirated and the remainder of the cyst was released and followed down into the joint and removed.  A small portion of the joint capsule was removed.  Wound was then thoroughly irrigated.  Subcutaneous tissue was closed with 4-0 Vicryl and the skin was closed with 5-0 nylon.  1/2% Marcaine was infiltrated into the tissues.  Dry sterile dressing and posterior splint were applied and tourniquet was removed with good return of blood flow to the foot.  Patient was awakened and taken recovery in good condition.  The tissue was sent to pathology for identification.  Park Breed, MD

## 2018-08-07 NOTE — H&P (Signed)
THE PATIENT WAS SEEN PRIOR TO SURGERY TODAY.  HISTORY, ALLERGIES, HOME MEDICATIONS AND OPERATIVE PROCEDURE WERE REVIEWED. RISKS AND BENEFITS OF SURGERY DISCUSSED WITH PATIENT AGAIN.  NO CHANGES FROM INITIAL HISTORY AND PHYSICAL NOTED.    

## 2018-08-07 NOTE — Anesthesia Post-op Follow-up Note (Signed)
Anesthesia QCDR form completed.        

## 2018-08-08 LAB — SURGICAL PATHOLOGY

## 2018-08-18 NOTE — Anesthesia Postprocedure Evaluation (Signed)
Anesthesia Post Note  Patient: Health visitor  Procedure(s) Performed: REMOVAL GANGLION CYST FOOT (Right Foot)  Patient location during evaluation: PACU Anesthesia Type: General Level of consciousness: awake and alert Pain management: pain level controlled Vital Signs Assessment: post-procedure vital signs reviewed and stable Respiratory status: spontaneous breathing, nonlabored ventilation, respiratory function stable and patient connected to nasal cannula oxygen Cardiovascular status: blood pressure returned to baseline and stable Postop Assessment: no apparent nausea or vomiting Anesthetic complications: no     Last Vitals:  Vitals:   08/07/18 0935 08/07/18 1002  BP: (!) 146/64 (P) 130/70  Pulse: 69 (P) 61  Resp: 16 (P) 16  Temp: (!) 36.2 C   SpO2: 100% (P) 100%    Last Pain:  Vitals:   08/07/18 0935  TempSrc: Temporal  PainSc: 0-No pain                 Molli Barrows

## 2018-08-23 ENCOUNTER — Encounter: Payer: Self-pay | Admitting: Family Medicine

## 2018-08-25 ENCOUNTER — Encounter: Payer: Self-pay | Admitting: Family Medicine

## 2018-09-04 ENCOUNTER — Ambulatory Visit: Payer: Managed Care, Other (non HMO) | Admitting: Family Medicine

## 2018-09-04 ENCOUNTER — Other Ambulatory Visit
Admission: RE | Admit: 2018-09-04 | Discharge: 2018-09-04 | Disposition: A | Discharge status: Home / Self Care | Payer: Managed Care, Other (non HMO) | Attending: Family Medicine | Admitting: Family Medicine

## 2018-09-04 ENCOUNTER — Encounter: Payer: Self-pay | Admitting: Family Medicine

## 2018-09-04 ENCOUNTER — Ambulatory Visit: Payer: Self-pay | Admitting: *Deleted

## 2018-09-04 ENCOUNTER — Telehealth: Payer: Self-pay | Admitting: Family Medicine

## 2018-09-04 VITALS — BP 120/74 | HR 74 | Temp 98.5°F | Ht 64.0 in | Wt 209.7 lb

## 2018-09-04 DIAGNOSIS — R519 Headache, unspecified: Secondary | ICD-10-CM

## 2018-09-04 DIAGNOSIS — R42 Dizziness and giddiness: Secondary | ICD-10-CM

## 2018-09-04 DIAGNOSIS — Z85820 Personal history of malignant melanoma of skin: Secondary | ICD-10-CM

## 2018-09-04 DIAGNOSIS — R110 Nausea: Secondary | ICD-10-CM

## 2018-09-04 DIAGNOSIS — R51 Headache: Secondary | ICD-10-CM | POA: Diagnosis not present

## 2018-09-04 LAB — CARBOXYHEMOGLOBIN - COOX: Carboxyhemoglobin: 0.8 % (ref 0.5–1.5)

## 2018-09-04 NOTE — Telephone Encounter (Signed)
Copied from [place] [phone]. Topic: Quick Communication - See Telephone Encounter >> Sep 04, 2018  1:12 PM Ivar Drape wrote: CRM for notification. See Telephone encounter for: 09/04/18. Tammy w/[facility] [phone] have the patient with her who was sent over for a Carboxy Hemoglobin.  She wants to know if they can do a Venus Sample vs an Arterial Sample.  Please advise.

## 2018-09-04 NOTE — Progress Notes (Signed)
BP 120/74   Pulse 74   Temp 98.5 F (36.9 C)   Ht 5\' 4"  (1.626 m)   Wt 209 lb 11.2 oz (95.1 kg)   SpO2 94%   BMI 35.99 kg/m    Subjective:    Patient ID: Julie Ayala, female    DOB: 01-06-1956, 63 y.o.   MRN: [id number]  HPI: Julie Ayala is a 63 y.o. female  Chief Complaint  Patient presents with  . Dizziness    Onset 08/25/18    HPI Patient is here for an acute visit She has been dizzy since Jan 10th (today is the 20th)  January 2nd and 3rd; bad headaches; could not work; not a migraine though Did not feel good after that but could work; having headache now, but not a "headache" now "Discomfort" over the top of the head; "spurts of pain"; not sharp pain in any one spot; "head feels full and it aches"; having nausea On January 10th, Dr. Ronnald Collum increased the [facility] from 300 mg to 600 mg That night, the dizziness started and it has been like that since then She thought the change was from the increase She has seen him twice now She had dizziness with nausea on Jan 11th and 12th, then took her dosage back down to 300 mg When she slept, she was short of breath and could not use the CPAP; CPAP made the dizziness worse; managed by Dr. Alva Garnet pulmonologist On January 13th, everything was continuing; saw Dr. 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 Ronnald Collum tomorrow to go over the labs and the [place]  She brought in her lab results from Dr. Ronnald Collum Cholesterol got worse  Heart rate noticeably slower on her watch; no constipation; energy level is okay  She has seen Dr. Saunders Revel, reviewed previous echo; did not really even want to call it PHTN because it was slight  LVEF 60-65% on last echo, normal diastolic function EKG done through Dr. Saunders Revel   Depression screen [facility] 2/9 09/04/2018 06/30/2018 05/03/2018 01/02/2018 08/25/2017  Decreased Interest 0 0 0 0 0  Down, Depressed, Hopeless 0 0 0 0 0  PHQ - 2 Score 0 0 0 0 0  Altered sleeping 0 0 0 - -  Tired, decreased energy 0 0 0 - -  Change in appetite 0 0 0 - -  Feeling bad or failure about yourself  0 0 0 - -  Trouble concentrating 0 0 0 - -  Moving slowly or fidgety/restless 0 0 0 - -  Suicidal thoughts 0 0 0 - -  PHQ-9 Score 0 0 0 - -  Difficult doing  work/chores Not difficult at all Not difficult at all Not difficult at all - -   Fall Risk  09/04/2018 06/30/2018 05/03/2018 01/02/2018 08/25/2017  Falls in the past year? 0 0 No No No  Number falls in past yr: - 0 - - -  Injury with Fall? - - - - -  Comment - - - - -    Relevant past medical, surgical, family and social history reviewed Past Medical History:  Diagnosis Date  . Allergy   . Asthma   . Asthma, mild intermittent, well-controlled 07/19/2015  . Benign Adrenal Adenoma    a. 09/2017 MRI Abd: 52mm L adrenal nodule, unchanged from 2013.  . Binge eating disorder   . 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 09/2014 Echo: Nl EF, mild TR/PR. Nl RV size/fxn; b. 11/2017 Echo: EF 60-65%, no rwma, nl RV fxn, PASP 34mmHg.  Marland Kitchen Fatty liver 08/24/2017   [place] Jan 2019  . History of chest pain    a. 02/2005 MV: EF 58%, fixed inf  defect, likely attenuation; b. 06/2010 MV: EF 80%, no ischemia/scar. HTN response; c. 09/2014 MV: EF 69%. Report not available. D/c summary indicates it was a nl study.  . Hyperlipidemia   . Hypertension   . IFG (impaired fasting glucose)   . Insomnia   . Lower extremity edema   . Motion sickness    all moving things  . OCD (obsessive compulsive disorder)   . PONV (postoperative nausea and vomiting)   . Post-menopausal   . Wears contact lenses    Past Surgical History:  Procedure Laterality Date  . BREAST BIOPSY Right    core bx- neg  . CHOLECYSTECTOMY  2013  . COLONOSCOPY  06/2012   small polyps, repeat in 06/2016  . COLONOSCOPY WITH PROPOFOL N/A 09/09/2017   Procedure: COLONOSCOPY WITH PROPOFOL;  Surgeon: Lucilla Lame, MD;  Location: [place];  Service: Endoscopy;  Laterality: N/A;  . CYSTECTOMY     uterine cyst removed  . CYSTECTOMY     Hyoid cyst removed  . DIAGNOSTIC LAPAROSCOPY     endometrosis  . GANGLION CYST EXCISION Right 08/07/2018   Procedure: REMOVAL GANGLION CYST FOOT;  Surgeon: Earnestine Leys, MD;  Location: [facility];  Service: Orthopedics;  Laterality: Right;  . MELANOMA EXCISION    . POLYPECTOMY  09/09/2017   Procedure: POLYPECTOMY;  Surgeon: Lucilla Lame, MD;  Location: [place];  Service: Endoscopy;;   Family History  Problem Relation Age of Onset  . Hypertension Mother   . Heart disease Mother        pacemaker  . Congestive Heart Failure Mother        with pacemaker  . Stroke Mother        TIA/CVA  . Cancer Mother        lung  . Breast cancer Mother 15  . Cancer Father        colon and lung  . Hypertension Sister   . Mental illness Sister        anxiety  . Hypothyroidism Sister   . Heart disease Brother        unknown heart condition name, EF 55%  . Hypertension Brother   . Cancer Son        melanoma  . Diabetes Paternal Aunt   . Diabetes Paternal Uncle   . Hypertension Brother   . Heart Problems Son  prolonged  QT syndrome (syncope)  . Stroke Maternal Grandmother   . COPD Maternal Aunt   . Cirrhosis Maternal Grandfather    Social History   Tobacco Use  . Smoking status: Former Smoker    Years: 0.50    Types: Cigarettes    Last attempt to quit: 03/14/1987    Years since quitting: 31.5  . Smokeless tobacco: Never Used  Substance Use Topics  . Alcohol use: Not on file    Comment: rare  . Drug use: No     Office Visit from 09/04/2018 in [facility]  AUDIT-C Score  2      Interim medical history since last visit reviewed. Allergies and medications reviewed  Review of Systems Per HPI unless specifically indicated above     Objective:    BP 120/74   Pulse 74   Temp 98.5 F (36.9 C)   Ht 5\' 4"  (1.626 m)   Wt 209 lb 11.2 oz (95.1 kg)   SpO2 94%   BMI 35.99 kg/m   Wt Readings from Last 3 Encounters:  09/04/18 209 lb 11.2 oz (95.1 kg)  08/07/18 196 lb (88.9 kg)  07/27/18 196 lb (88.9 kg)    Physical Exam Constitutional:      General: She is not in acute distress.    Appearance: She is well-developed. She is not diaphoretic.  HENT:     Head: Normocephalic and atraumatic.  Eyes:     General: No scleral icterus. Neck:     Thyroid: No thyromegaly.  Cardiovascular:     Rate and Rhythm: Normal rate and regular rhythm.     Heart sounds: Normal heart sounds. No murmur.  Pulmonary:     Effort: Pulmonary effort is normal. No respiratory distress.     Breath sounds: Normal breath sounds. No wheezing.  Abdominal:     General: Bowel sounds are normal. There is no distension.     Palpations: Abdomen is soft.  Musculoskeletal:     Right lower leg: Edema (1+) present.     Left lower leg: Edema (1+) present.  Skin:    General: Skin is warm and dry.     Coloration: Skin is not pale.  Neurological:     Mental Status: She is alert.  Psychiatric:        Behavior: Behavior normal.        Thought Content: Thought content normal.        Judgment: Judgment normal.       Results for orders placed or performed during the hospital encounter of 08/07/18  CBC  Result Value Ref Range   WBC 6.0 4.0 - 10.5 K/uL   RBC 4.41 3.87 - 5.11 MIL/uL   Hemoglobin 13.2 12.0 - 15.0 g/dL   HCT 38.8 36.0 - 46.0 %   MCV 88.0 80.0 - 100.0 fL   MCH 29.9 26.0 - 34.0 pg   MCHC 34.0 30.0 - 36.0 g/dL   RDW 12.8 11.5 - 15.5 %   Platelets 296 150 - 400 K/uL   nRBC 0.0 0.0 - 0.2 %  Surgical pathology  Result Value Ref Range   SURGICAL PATHOLOGY      Surgical Pathology CASE: [id number] PATIENT: Julie Ayala Surgical Pathology Report     SPECIMEN SUBMITTED: A. Ganglion cyst, right foot  CLINICAL HISTORY: None provided  PRE-OPERATIVE DIAGNOSIS: Ganglion of right foot  POST-OPERATIVE DIAGNOSIS: Same as pre-op     DIAGNOSIS: A. SOFT TISSUE, RIGHT FOOT; EXCISION: - GANGLION CYST.  GROSS DESCRIPTION: A. Labeled: Ganglion cyst right foot Received: Formalin Tissue fragment(s): 1 Size: 2.6 x 1.0 x 0.5 cm Description: Irregular fragment of pale tan-pink, soft tissue.  No abnormalities or mass lesions are grossly identified.  Serially sectioned. Entirely submitted in 1 cassette.   Final Diagnosis performed by Bryan Lemma, MD.   Electronically signed 08/08/2018 5:52:40PM The electronic signature indicates that the named Attending Pathologist has evaluated the specimen  Technical component performed at [facility], [address] [place], [place] [address] Lab: [phone] Dir: Rush Farmer, MD, MMM   Professional component performed at [facility], [facility], [place], [place], [place] [address] Lab: [phone] Dir: Dellia Nims. Rubinas, MD       Assessment & Plan:   Problem List Items Addressed This Visit    None    Visit Diagnoses    Dizziness    -  Primary   with headaches, hx of melanoma; will get brain imaging; to ER if worse   Relevant Orders   Carboxyhemoglobin (single result) (Completed)   Acute nonintractable  headache, unspecified headache type       will get brain imaging since she has had melanoma and abrupt onset of dizziness and headaches   Hx of melanoma of skin       managed by derm   Nausea       combined with headaches and hx of melanoma; brain imaging       Follow up plan: No follow-ups on file.  An after-visit summary was printed and given to the patient at [place].  Please see the patient instructions which may contain other information and recommendations beyond what is mentioned above in the assessment and plan.  No orders of the defined types were placed in this encounter.   Orders Placed This Encounter  Procedures  . Carboxyhemoglobin (single result)

## 2018-09-04 NOTE — Telephone Encounter (Signed)
Experiencing dizziness with vertigo. 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 It was increased from 300 to 600 MG on 08/25/18 and this was when she began to experience dizziness.  Dr. Shearon Stalls stopped the medication on 09/15/18. She continues to experience dizziness daily. Last b/p was 118/76. Last chemistry panel  was done Tuesday.Scheduled with Dr. Sanda Klein for 11:00am today.  Reason for Disposition . Taking a medicine that could cause dizziness (e.g., phenytoin [Dilantin], carbamazepine [Tegretol], primidone [Mysoline])  Answer Assessment - Initial Assessment Questions 1. DESCRIPTION: "Describe your dizziness."     Leads to vertigo. 2. VERTIGO: "Do you feel like either you or the room is spinning or tilting?"      yes 3. LIGHTHEADED: "Do you feel lightheaded?" (e.g., somewhat faint, woozy, weak upon standing)     yes 4. SEVERITY: "How bad is it?"  "Can you walk?"   - MILD - Feels unsteady but walking normally.   - MODERATE - Feels very unsteady when walking, but not falling; interferes with normal activities (e.g., school, work) .   - SEVERE - Unable to walk without falling (requires assistance).     Moderate-daily 5. ONSET:  "When did the dizziness begin?"     January 10 6. AGGRAVATING FACTORS: "Does anything make it worse?" (e.g., standing, change in head position)     everything 7. CAUSE: "What do you think is causing the dizziness?"     Possible medication, possible hvac with carbon monoxide-she is unsure-none detected with monitors. 8. RECURRENT SYMPTOM: "Have you had dizziness before?" If so, ask: "When was the last time?" "What happened that time?"    no 9. OTHER SYMPTOMS: "Do you have any other symptoms?" (e.g., headache, weakness, numbness, vomiting, earache)     Headache, earache right ear intermittent pain 10. PREGNANCY: "Is there any chance you are pregnant?" "When was your last menstrual period?"       no  Protocols used: DIZZINESS -  VERTIGO-A-AH

## 2018-09-04 NOTE — Telephone Encounter (Signed)
Called patient, she states this has already been taken care of

## 2018-09-04 NOTE — Patient Instructions (Addendum)
Preventing Health Risks of Being Overweight Maintaining a healthy body weight is an important part of your overall health. Your healthy body weight depends on your age, gender, and height. Being overweight puts you at risk for many health problems, including:  Heart disease.  Diabetes.  Problems sleeping.  Joint problems. You can make changes to your diet and lifestyle to prevent these risks. Consider working with a health care provider or a dietitian to make these changes. What nutrition changes can be made?   Eat only as much as your body needs. In most cases, this is about 2,000 calories a day, but the amount varies depending on your height, gender, and activity level. Ask your health care provider how many calories you should have each day. Eating more than your body needs on a regular basis can cause you to become overweight or obese.  Eat slowly, and stop eating when you feel full.  Choose healthy foods, including: ? Fruits and vegetables. ? Lean meats. ? Low-fat dairy products. ? High-fiber foods, such as whole grains and beans. ? Healthy snacks like vegetable sticks, a piece of fruit, or a small amount of yogurt or cheese.  Avoid foods and drinks that are high in sugar, salt (sodium), saturated fat, or trans fat. This includes: ? Many desserts such as candy, cookies, and ice cream. ? Soda. ? Fried foods. ? Processed meats such as hot dogs or lunch meats. ? Prepackaged snack foods. What lifestyle changes can be made?   Exercise for at least 150 minutes a week to prevent weight gain, or as often as recommended by your health care provider. Do moderate-intensity exercise, such as brisk walking. ? Spread it out by exercising for 30 minutes 5 days a week, or in short 10-minute bursts several times a day.  Find other ways to stay active and burn calories, such as yard work or a hobby that involves physical activity.  Get at least 8 hours of sleep each night. When you  are well-rested, you are more likely to be active and make healthy choices during the day. To sleep better: ? Try to go to bed and wake up at about the same time every day. ? Keep your bedroom dark, quiet, and cool. ? Make sure that your bed is comfortable. ? Avoid stimulating activities, such as watching television or exercising, for at least one hour before bedtime. Why are these changes important? Eating healthy and being active helps you lose weight and prevent health problems caused by being overweight. Making these changes can also help you manage stress, feel better mentally, and connect with friends and family. What can happen if changes are not made? Being overweight can affect you for your entire life. You may develop joint or bone problems that make it painful or difficult for you to play sports or do activities you enjoy. Being overweight puts stress on your heart and lungs and can lead to medical problems like diabetes, heart disease, and sleeping problems. Where to find support You can get support for preventing health risks of being overweight from:  Your health care provider or a dietitian. They can provide guidance about healthy eating and healthy lifestyle choices.  Weight loss support groups, online or in-person. Where to find more information  MyPlate: FormerBoss.no ? This an online tool that provides personalized recommendations about foods to eat each day.  The Centers for Disease Control and Prevention: http://sharp-hammond.biz/ ? This resource gives tips for managing weight and having  an active lifestyle. Summary  To prevent unhealthy weight gain, it is important to maintain a healthy diet high in vegetables and whole grains, exercise regularly, and get at least 8 hours of sleep each night.  Making these changes helps prevent many long-term (chronic) health conditions that can shorten your life, such as diabetes, heart disease, and stroke. This information  is not intended to replace advice given to you by your health care provider. Make sure you discuss any questions you have with your health care provider. Document Released: 06/29/2017 Document Revised: 06/29/2017 Document Reviewed: 06/29/2017 Elsevier Interactive Patient Education  2019 [employer].  Let's get the lab at the hospital today and a head CT Avoid salt as much as possible Go to the ER if your symptoms get serious

## 2018-09-05 ENCOUNTER — Encounter: Payer: Self-pay | Admitting: Family Medicine

## 2018-09-06 ENCOUNTER — Encounter: Payer: Self-pay | Admitting: Family Medicine

## 2018-09-11 ENCOUNTER — Telehealth: Payer: Self-pay | Admitting: Family Medicine

## 2018-09-11 DIAGNOSIS — R519 Headache, unspecified: Secondary | ICD-10-CM

## 2018-09-11 DIAGNOSIS — R51 Headache: Principal | ICD-10-CM

## 2018-09-11 NOTE — Telephone Encounter (Signed)
I spoke with peer for consultation MRI brain with and without contrast at [facility] for authorization: [id number] Exp: December 05, 2018 ------------------------------------------------------------ Please let patient know that peer-to-peer review done, they recommend MRI with and without contrast Please cancel the head CT Help with scheduling of MRI ASAP please Thank you

## 2018-09-13 ENCOUNTER — Encounter: Payer: Self-pay | Admitting: Family Medicine

## 2018-09-13 NOTE — Telephone Encounter (Signed)
I see that patient has the MRI scheduled for Jan 31st Head CT has been canceled

## 2018-09-14 NOTE — Telephone Encounter (Signed)
Patient has already been notified, is this not correct?

## 2018-09-15 ENCOUNTER — Ambulatory Visit
Admission: RE | Admit: 2018-09-15 | Discharge: 2018-09-15 | Disposition: A | Discharge status: Home / Self Care | Payer: Managed Care, Other (non HMO) | Source: Ambulatory Visit | Attending: Family Medicine | Admitting: Family Medicine

## 2018-09-15 ENCOUNTER — Encounter: Payer: Self-pay | Admitting: Family Medicine

## 2018-09-15 DIAGNOSIS — R519 Headache, unspecified: Secondary | ICD-10-CM

## 2018-09-15 DIAGNOSIS — R51 Headache: Secondary | ICD-10-CM | POA: Insufficient documentation

## 2018-09-15 MED ORDER — GADOBUTROL 1 MMOL/ML IV SOLN
9.0000 mL | Freq: Once | INTRAVENOUS | Status: AC | PRN
  Administered 2018-09-15: 9 mL via INTRAVENOUS

## 2018-09-18 ENCOUNTER — Encounter: Payer: Self-pay | Admitting: Family Medicine

## 2018-09-19 ENCOUNTER — Encounter: Payer: Self-pay | Admitting: Family Medicine

## 2018-09-22 ENCOUNTER — Telehealth: Payer: Self-pay | Admitting: *Deleted

## 2018-09-22 NOTE — Telephone Encounter (Signed)
LMTB and scheduled f/u in 2-3 weeks.

## 2018-09-22 NOTE — Telephone Encounter (Signed)
-----   Message from Wilhelmina Mcardle, MD sent at 09/19/2018  2:13 PM EST ----- Please schedule follow up in 2-3 weeks to address problem of hoarseness and CPAP intolerance  Thanks  Waunita Schooner

## 2018-09-23 ENCOUNTER — Other Ambulatory Visit: Payer: Self-pay | Admitting: Family Medicine

## 2018-09-25 ENCOUNTER — Encounter: Payer: Self-pay | Admitting: Family Medicine

## 2018-10-13 ENCOUNTER — Ambulatory Visit
Admission: RE | Admit: 2018-10-13 | Discharge: 2018-10-13 | Disposition: A | Discharge status: Home / Self Care | Payer: Managed Care, Other (non HMO) | Source: Ambulatory Visit | Attending: Family Medicine | Admitting: Family Medicine

## 2018-10-13 DIAGNOSIS — Z1239 Encounter for other screening for malignant neoplasm of breast: Secondary | ICD-10-CM

## 2018-10-13 DIAGNOSIS — Z1231 Encounter for screening mammogram for malignant neoplasm of breast: Secondary | ICD-10-CM | POA: Diagnosis not present

## 2018-10-17 ENCOUNTER — Encounter: Payer: Self-pay | Admitting: Family Medicine

## 2018-10-24 NOTE — Telephone Encounter (Signed)
Received FMLA/Disablity paperwork from [facility], needing signatures from physician. Will be placed up front in inbox.

## 2019-02-26 ENCOUNTER — Encounter: Payer: Self-pay | Admitting: Family Medicine

## 2019-03-01 ENCOUNTER — Other Ambulatory Visit: Payer: Self-pay

## 2019-03-01 ENCOUNTER — Encounter: Payer: Self-pay | Admitting: Nurse Practitioner

## 2019-03-01 ENCOUNTER — Ambulatory Visit (INDEPENDENT_AMBULATORY_CARE_PROVIDER_SITE_OTHER): Payer: Managed Care, Other (non HMO) | Admitting: Nurse Practitioner

## 2019-03-01 VITALS — BP 118/68 | HR 63

## 2019-03-01 DIAGNOSIS — E249 Cushing's syndrome, unspecified: Secondary | ICD-10-CM

## 2019-03-01 DIAGNOSIS — F410 Panic disorder [episodic paroxysmal anxiety] without agoraphobia: Secondary | ICD-10-CM

## 2019-03-01 DIAGNOSIS — F411 Generalized anxiety disorder: Secondary | ICD-10-CM | POA: Diagnosis not present

## 2019-03-01 DIAGNOSIS — I2721 Secondary pulmonary arterial hypertension: Secondary | ICD-10-CM

## 2019-03-01 DIAGNOSIS — C439 Malignant melanoma of skin, unspecified: Secondary | ICD-10-CM | POA: Diagnosis not present

## 2019-03-01 DIAGNOSIS — J45998 Other asthma: Secondary | ICD-10-CM

## 2019-03-01 MED ORDER — ESCITALOPRAM OXALATE 10 MG PO TABS: 10.0000 mg | ORAL_TABLET | Freq: Every day | ORAL | 2 refills | Status: DC

## 2019-03-01 MED ORDER — HYDROXYZINE HCL 25 MG PO TABS: 25.0000 mg | ORAL_TABLET | Freq: Three times a day (TID) | ORAL | 0 refills | Status: DC | PRN
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
## 2019-03-14 ENCOUNTER — Ambulatory Visit (INDEPENDENT_AMBULATORY_CARE_PROVIDER_SITE_OTHER): Payer: Managed Care, Other (non HMO) | Admitting: Nurse Practitioner

## 2019-03-14 ENCOUNTER — Other Ambulatory Visit: Payer: Self-pay

## 2019-03-14 ENCOUNTER — Encounter: Payer: Self-pay | Admitting: Nurse Practitioner

## 2019-03-14 VITALS — BP 120/78 | HR 68 | Resp 16

## 2019-03-14 DIAGNOSIS — F411 Generalized anxiety disorder: Secondary | ICD-10-CM

## 2019-03-14 DIAGNOSIS — F410 Panic disorder [episodic paroxysmal anxiety] without agoraphobia: Secondary | ICD-10-CM | POA: Diagnosis not present

## 2019-03-14 NOTE — Patient Instructions (Addendum)
-   Continue lexapro 10mg  daily; in 2-4 weeks we can increase to lexapro 20mg  daily if still not feeling full effect.  - only take hydroxyzine as needed for acute panic.
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
## 2019-03-28 ENCOUNTER — Encounter: Payer: Self-pay | Admitting: Family Medicine

## 2019-03-28 ENCOUNTER — Other Ambulatory Visit: Payer: Self-pay | Admitting: Nurse Practitioner

## 2019-03-28 DIAGNOSIS — F410 Panic disorder [episodic paroxysmal anxiety] without agoraphobia: Secondary | ICD-10-CM

## 2019-03-28 DIAGNOSIS — F411 Generalized anxiety disorder: Secondary | ICD-10-CM

## 2019-03-28 MED ORDER — ESCITALOPRAM OXALATE 10 MG PO TABS: 10.0000 mg | ORAL_TABLET | Freq: Every day | ORAL | 2 refills | Status: DC

## 2019-03-28 MED ORDER — HYDROXYZINE HCL 25 MG PO TABS: 25.0000 mg | ORAL_TABLET | Freq: Three times a day (TID) | ORAL | 0 refills | Status: DC | PRN

## 2019-04-16 ENCOUNTER — Other Ambulatory Visit: Payer: Self-pay

## 2019-04-16 ENCOUNTER — Ambulatory Visit (INDEPENDENT_AMBULATORY_CARE_PROVIDER_SITE_OTHER): Payer: Managed Care, Other (non HMO) | Admitting: Nurse Practitioner

## 2019-04-16 ENCOUNTER — Encounter: Payer: Self-pay | Admitting: Nurse Practitioner

## 2019-04-16 ENCOUNTER — Ambulatory Visit: Payer: Managed Care, Other (non HMO) | Admitting: Nurse Practitioner

## 2019-04-16 VITALS — BP 118/72

## 2019-04-16 DIAGNOSIS — F411 Generalized anxiety disorder: Secondary | ICD-10-CM

## 2019-04-16 DIAGNOSIS — R42 Dizziness and giddiness: Secondary | ICD-10-CM

## 2019-04-16 DIAGNOSIS — F410 Panic disorder [episodic paroxysmal anxiety] without agoraphobia: Secondary | ICD-10-CM | POA: Diagnosis not present

## 2019-04-16 MED ORDER — ESCITALOPRAM OXALATE 10 MG PO TABS: 10.0000 mg | ORAL_TABLET | Freq: Every day | ORAL | 1 refills | Status: DC

## 2019-04-16 MED ORDER — MECLIZINE HCL 12.5 MG PO TABS: 12.5000 mg | ORAL_TABLET | Freq: Three times a day (TID) | ORAL | 0 refills | Status: DC | PRN

## 2019-04-16 NOTE — Progress Notes (Signed)
Virtual Visit via Video Note  I connected with Julie Ayala on 04/16/19 at  8:40 AM EDT by a video enabled telemedicine application and verified that I am speaking with the correct person using two identifiers.   Staff discussed the limitations of evaluation and management by telemedicine and the availability of in person appointments. The patient expressed understanding and agreed to proceed.  Patient location: home  My location: work office Other people present: none HPI  Patient presents for one month follow-up on lexapro 10mg  daily- takes it every evening.  States her episodes of waking up with anxiety are much less frequent and last much less when it does happen. States in the last week has woken up 3/7 days, previously it was every day and it was very intense.  Took one of the vistaril in the last month due to increased anxiety- states it was triggered by work project and it helped her anxiety, she also stepped away and took a walk and did some breathing techniques and it helped. She is talking to mentors from church small group that she talks to multiple times a week and had been going well. A lot of her anxiety had been steming form her younger sons separation and concern for if she would see the grandkids- she has seen him since then and it has helped her.   Patient has been having vertigo for the past 3 weeks, in January was having very severe cases- had seen endocrinology and ENT who was unable to find anything. States has been feeling swimmy headed when she turns quickly- lasts a few seconds and resolves. States one time got a little bit nauseated.  PHQ2/9: Depression screen [facility] 2/9 04/16/2019 03/14/2019 03/01/2019 09/04/2018 06/30/2018  Decreased Interest 0 0 0 0 0  Down, Depressed, Hopeless 0 0 0 0 0  PHQ - 2 Score 0 0 0 0 0  Altered sleeping 0 0 0 0 0  Tired, decreased energy 0 0 0 0 0  Change in appetite 0 0 0 0 0  Feeling bad or failure about yourself  0 0 0 0 0  Trouble  concentrating 0 0 0 0 0  Moving slowly or fidgety/restless 0 0 0 0 0  Suicidal thoughts 0 0 0 0 0  PHQ-9 Score 0 0 0 0 0  Difficult doing work/chores Not difficult at all Not difficult at all Not difficult at all Not difficult at all Not difficult at all  Some recent data might be hidden     PHQ reviewed. Negative  Patient Active Problem List   Diagnosis Date Noted  . GAD (generalized anxiety disorder) 03/14/2019  . Panic attack 03/14/2019  . Asthma, persistent not controlled 06/30/2018  . 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 PAH (pulmonary artery hypertension) ([place]) 12/08/2017  . Polyp of sigmoid colon   . Elevated ferritin 08/26/2017  . Essential hypertension, benign 08/25/2017  . Fatty liver 08/24/2017  . Elevated serum glutamic pyruvic transaminase (SGPT) level 07/19/2017  . Obesity 05/31/2016  . Controlled substance agreement signed 03/14/2016  . Hyperlipidemia   . Sciatica   . OCD (obsessive compulsive disorder)   . Binge eating disorder   . IFG (impaired fasting glucose)   . Insomnia   . 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 Allergy   . Asthma   . Asthma, mild intermittent, well-controlled 07/19/2015  . Benign Adrenal Adenoma    a. 09/2017 MRI Abd: 89mm L  adrenal nodule, unchanged from 2013.  . Binge eating disorder   . 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 09/2014 Echo: Nl EF, mild TR/PR. Nl RV size/fxn; b. 11/2017 Echo: EF 60-65%, no rwma, nl RV fxn, PASP 68mmHg.  [place] Kitchen Fatty liver 08/24/2017   [place] Jan 2019  . History of chest pain    a. 02/2005 MV: EF 58%, fixed inf defect, likely attenuation; b. 06/2010 MV: EF 80%, no ischemia/scar. HTN response; c. 09/2014 MV: EF 69%. Report not available. D/c summary indicates it was a nl study.  . Hyperlipidemia   . Hypertension   . IFG (impaired fasting glucose)   . Insomnia   . Lower extremity edema   . Motion sickness    all moving  things  . OCD (obsessive compulsive disorder)   . Personal history of colonic polyps   . PONV (postoperative nausea and vomiting)   . Post-menopausal   . Wears contact lenses     Past Surgical History:  Procedure Laterality Date  . BREAST BIOPSY Right 2010   stereo core bx- neg  . CHOLECYSTECTOMY  2013  . COLONOSCOPY  06/2012   small polyps, repeat in 06/2016  . COLONOSCOPY WITH PROPOFOL N/A 09/09/2017   Procedure: COLONOSCOPY WITH PROPOFOL;  Surgeon: Lucilla Lame, MD;  Location: [place];  Service: Endoscopy;  Laterality: N/A;  . CYSTECTOMY     uterine cyst removed  . CYSTECTOMY     Hyoid cyst removed  . DIAGNOSTIC LAPAROSCOPY     endometrosis  . GANGLION CYST EXCISION Right 08/07/2018   Procedure: REMOVAL GANGLION CYST FOOT;  Surgeon: Earnestine Leys, MD;  Location: [facility];  Service: Orthopedics;  Laterality: Right;  . MELANOMA EXCISION    . POLYPECTOMY  09/09/2017   Procedure: POLYPECTOMY;  Surgeon: Lucilla Lame, MD;  Location: [place];  Service: Endoscopy;;    Social History   Tobacco Use  . Smoking status: Former Smoker    Years: 0.50    Types: Cigarettes    Quit date: 03/14/1987    Years since quitting: 32.1  . Smokeless tobacco: Never Used  Substance Use Topics  . Alcohol Use    Alcohol/week: 1.0 standard drinks    Types: 1 Glasses of wine per week    Frequency: Never    Comment: rare     Current Outpatient Medications:  .  albuterol (PROVENTIL HFA;VENTOLIN HFA) 108 (90 Base) MCG/ACT inhaler, Inhale 2 puffs into the lungs every 4 (four) hours as needed for wheezing or shortness of breath., Disp: 1 Inhaler, Rfl: 3 .  budesonide-formoterol (SYMBICORT) 160-4.5 MCG/ACT inhaler, Inhale 2 puffs into the lungs 2 (two) times daily., Disp: 1 Inhaler, Rfl: 11 .  escitalopram (LEXAPRO) 10 MG tablet, Take 1 tablet (10 mg total) by mouth daily., Disp: 90 tablet, Rfl: 1 .  ezetimibe (ZETIA) 10 MG tablet, TAKE 1 TABLET(10 MG) BY MOUTH DAILY, Disp: 90  tablet, Rfl: 3 .  furosemide (LASIX) 40 MG tablet, Take 40 mg by mouth daily., Disp: , Rfl:  .  hydrOXYzine (ATARAX/VISTARIL) 25 MG tablet, Take 1 tablet (25 mg total) by mouth 3 (three) times daily as needed., Disp: 30 tablet, Rfl: 0 .  meclizine (ANTIVERT) 12.5 MG tablet, Take 1 tablet (12.5 mg total) by mouth 3 (three) times daily as needed for dizziness., Disp: 30 tablet, Rfl: 0  Allergies  Allergen Reactions  . Codeine Diarrhea and Nausea And Vomiting  .  Pravastatin Other (See Comments)    myalgias  . Latex Rash    bandaids and tape only  . Sulfa Antibiotics Rash    ROS   No other specific complaints in a complete review of systems (except as listed in HPI above).  Objective  Vitals:   04/16/19 0848  BP: 118/72     There is no height or weight on file to calculate BMI.  Nursing Note and Vital Signs reviewed.  Physical Exam   Constitutional: Patient appears well-developed and well-nourished. No distress.  HENT: Head: Normocephalic and atraumatic. Cardiovascular: Normal rate Pulmonary/Chest: Effort normal  Neurological: alert and oriented, speech normal. EOM intact Psychiatric: Patient has a normal mood and affect. behavior is normal. Judgment and thought content normal.    Assessment & Plan  1. GAD (generalized anxiety disorder) Improved, does not want to increase dose  - escitalopram (LEXAPRO) 10 MG tablet; Take 1 tablet (10 mg total) by mouth daily.  Dispense: 90 tablet; Refill: 1  2. Panic attack Improving, only had one in last 4 weeks   3. Vertigo Discussed red flag symptoms, reviewed MRI from January- discussed with patient  - meclizine (ANTIVERT) 12.5 MG tablet; Take 1 tablet (12.5 mg total) by mouth 3 (three) times daily as needed for dizziness.  Dispense: 30 tablet; Refill: 0    Follow Up Instructions:   6 months  I discussed the assessment and treatment plan with the patient. The patient was provided an opportunity to ask questions and all  were answered. The patient agreed with the plan and demonstrated an understanding of the instructions.   The patient was advised to call back or seek an in-person evaluation if the symptoms worsen or if the condition fails to improve as anticipated.  I provided 22 minutes of non-face-to-face time during this encounter.   Fredderick Severance, NP

## 2019-05-23 ENCOUNTER — Encounter: Payer: Self-pay | Admitting: Family Medicine

## 2019-06-08 IMAGING — MR MR ABDOMEN WO/W CM
10 of 17 series · 28 of 48 positions shown · IV contrast (19mL MULTIHANCE)
Comparison: CT abdomen/pelvis dated 05/24/2014. Partial comparison
to CT chest dated 10/11/2011.

CLINICAL DATA: Abnormal urine metanephrines, evaluate adrenal
glands. New onset hypertension.

EXAM:
MRI ABDOMEN WITHOUT AND WITH CONTRAST
TECHNIQUE: Multiplanar multisequence MR imaging of the abdomen was performed
both before and after the administration of intravenous contrast.
CONTRAST:  19mL MULTIHANCE GADOBENATE DIMEGLUMINE 529 MG/ML IV SOLN

[Series 2: T2 · coronal · 7.0mm · 0.78mm/px · 2 of 31 slices shown]
[im 1/31]
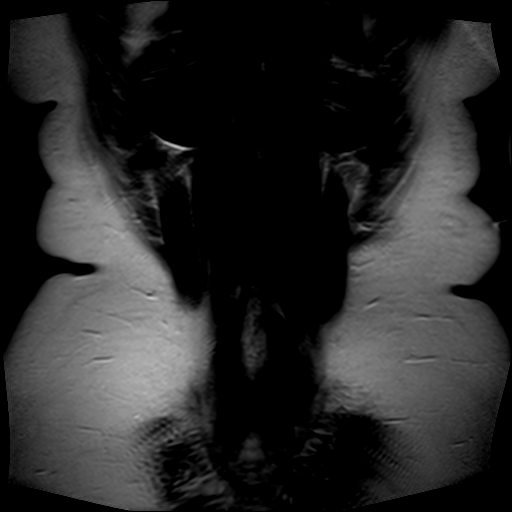
[im 31/31]
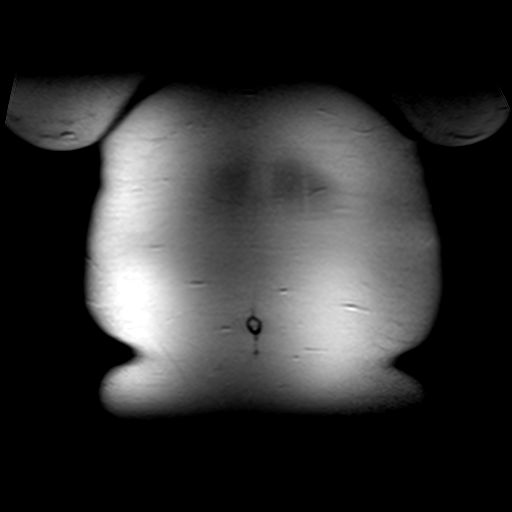

[Series 3: T2 fat-sat · axial · 7.0mm · 0.74mm/px · z∈[-79,+154]mm · 2 of 30 slices shown]
[im 1/30]
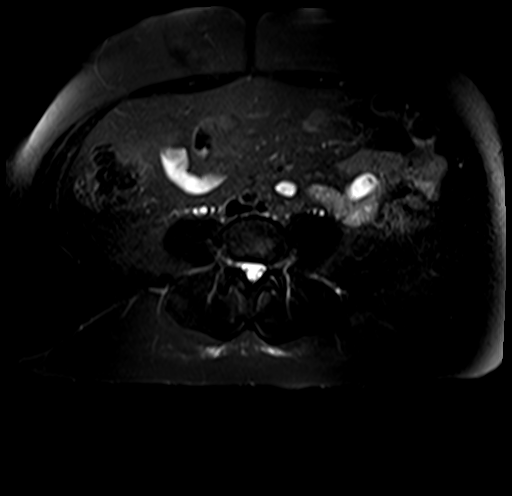
[im 30/30]
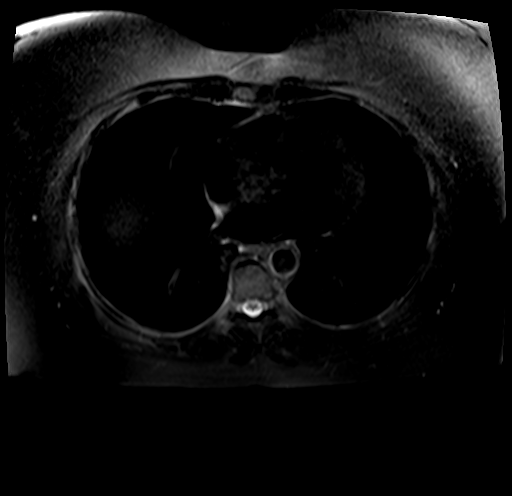

[Series 5: DWI · axial · 7.0mm · 1.98mm/px · z∈[-85,+164]mm · 5 of 96 slices shown]
[im 1/96]
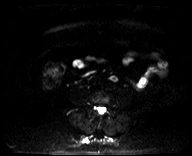
[im 24/96]
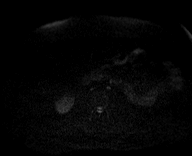
[im 48/96]
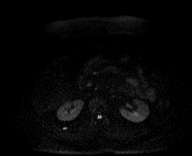
[im 72/96]
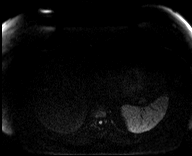
[im 96/96]
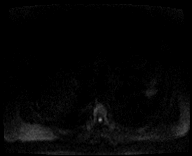

[Series 6: ax dwi_adc · axial · 7.0mm · 1.98mm/px · z∈[-85,+164]mm · 2 of 32 slices shown]
[im 1/32]
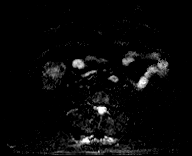
[im 32/32]
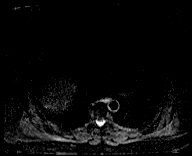

[Series 7: T1 · axial · 7.0mm · 0.74mm/px · z∈[-79,+154]mm · 4 of 60 slices shown (1 of 2)]
[im 1/60]
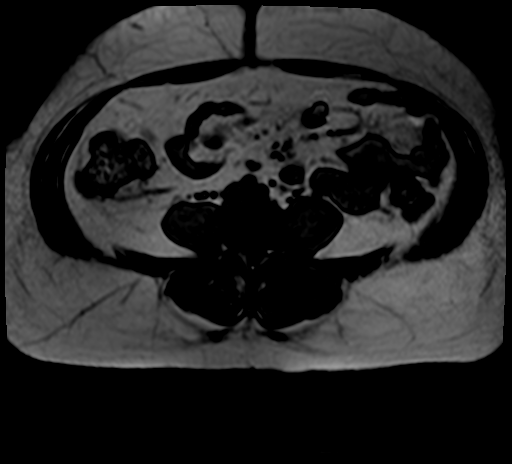
[im 20/60]
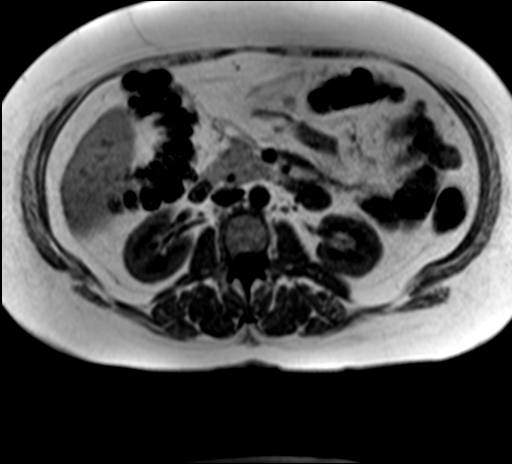
[im 40/60]
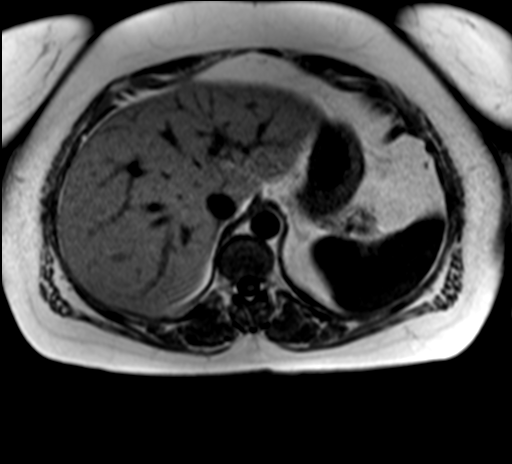
[im 60/60]
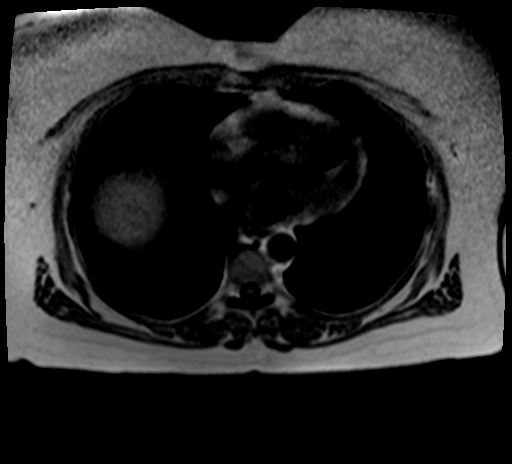

[Series 8: T1 · coronal · 7.0mm · 0.74mm/px · 2 of 48 slices shown (2 of 2)]
[im 1/48]
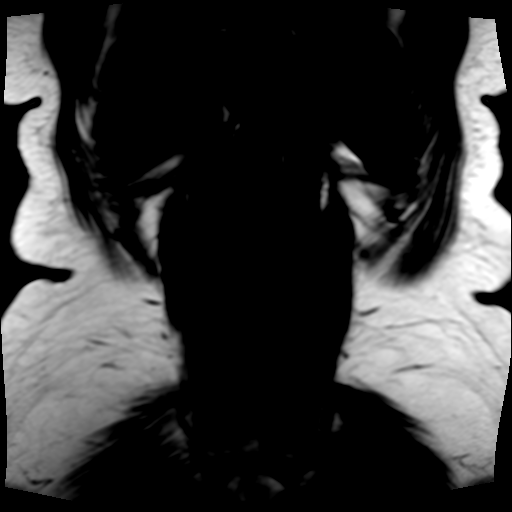
[im 48/48]
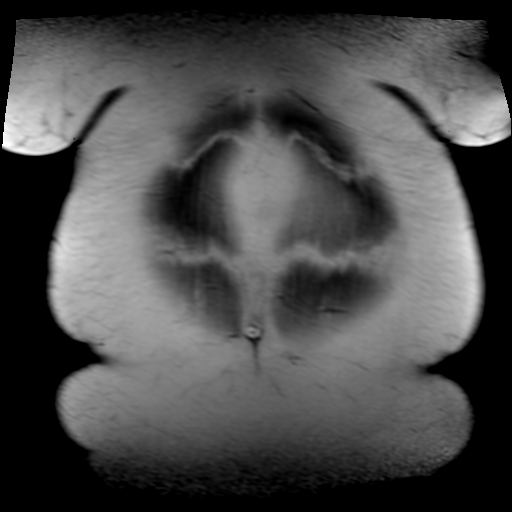

[Series 9: T1 dynamic fat-sat · axial · non-contrast · 3.0mm · 0.74mm/px · z∈[-81,+156]mm · 3 of 80 slices shown (1 of 2)]
[im 1/80]
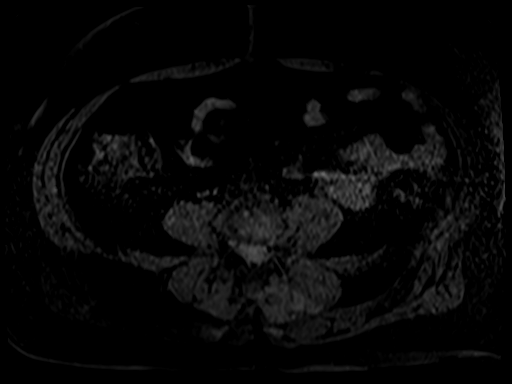
[im 40/80]
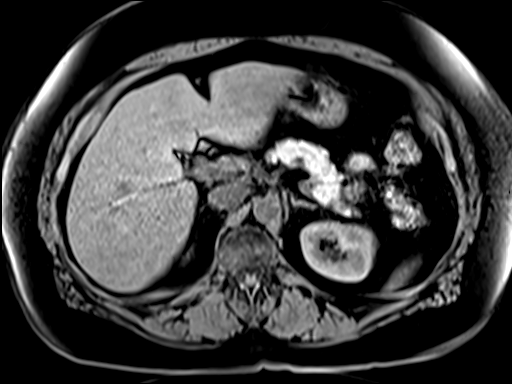
[im 80/80]
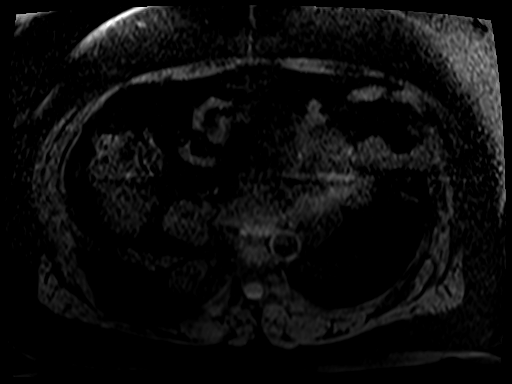

[Series 10: T1 dynamic fat-sat post-contrast · axial · 3.0mm · 0.74mm/px · z∈[-81,+156]mm · 3 of 80 slices shown (1 of 2)]
[im 1/80]
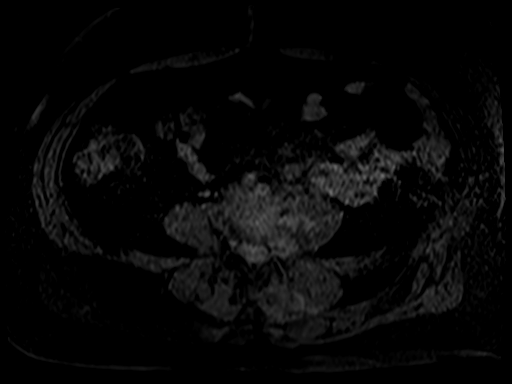
[im 40/80]
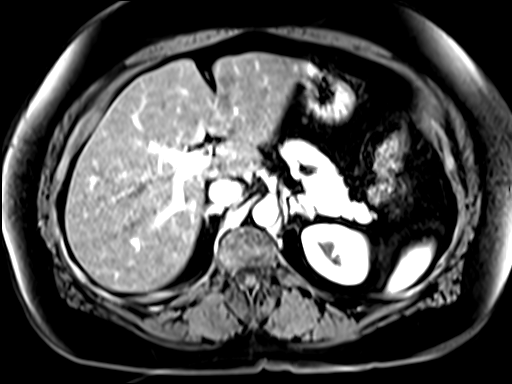
[im 80/80]
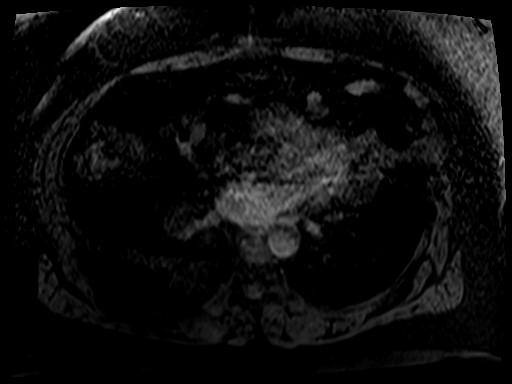

[Series 11: T1 dynamic fat-sat · axial · 3.0mm · 0.74mm/px · z∈[-81,+156]mm · 3 of 80 slices shown (2 of 2)]
[im 1/80]
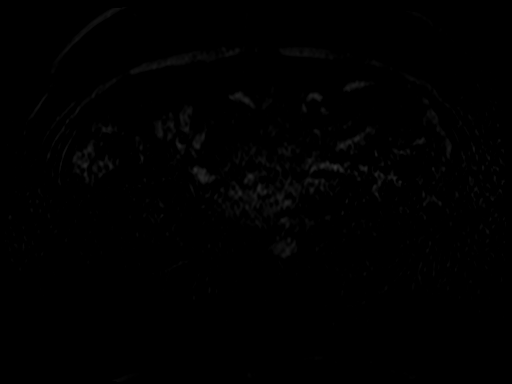
[im 40/80]
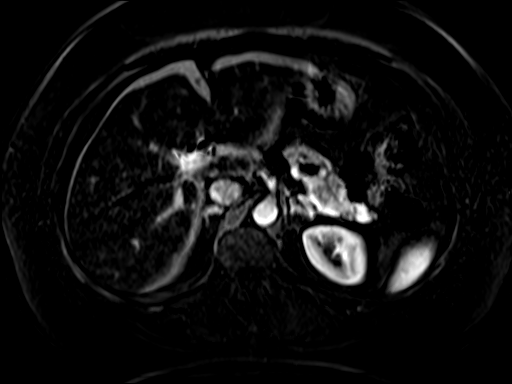
[im 80/80]
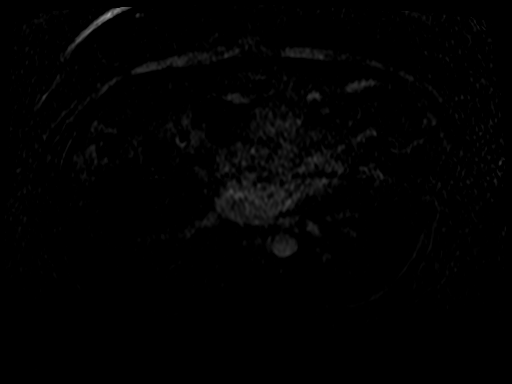

[Series 12: T1 dynamic fat-sat post-contrast · axial · 3.0mm · 0.74mm/px · z∈[-81,+36]mm · 2 of 80 slices shown (2 of 2)]
[im 1/80]
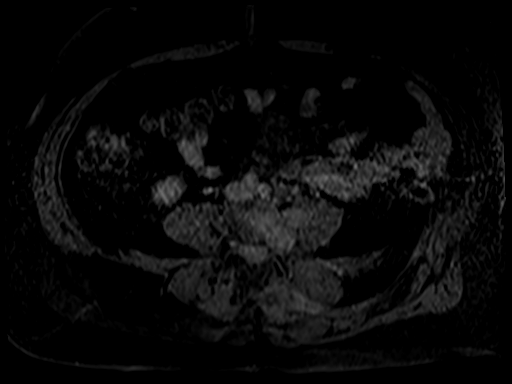
[im 40/80]
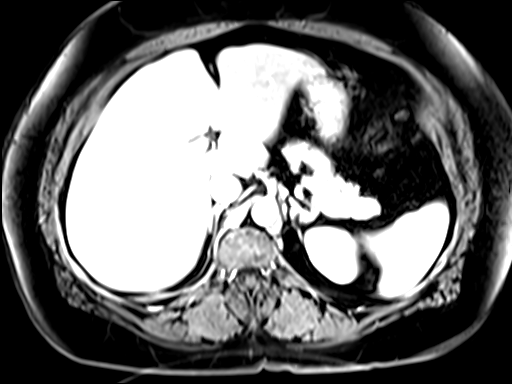

[28 of 48 positions shown; findings below may reference images not displayed]

FINDINGS: Lower chest: Lung bases are clear.

Hepatobiliary: Moderate to severe hepatic steatosis. Liver is
otherwise within normal limits. No suspicious/enhancing hepatic
lesions.

Status post cholecystectomy. No intrahepatic or extrahepatic ductal
dilatation.

Pancreas:  Within normal limits.

Spleen:  Within normal limits.

Adrenals/Urinary Tract: 13 mm left adrenal nodule, unchanged from
9805. Mild signal loss on opposed phase imaging, suggesting a benign
adrenal adenoma. Nodule is dark on T2 (series 20/image 15), arguing
against pheochromocytoma.

Right adrenal gland is within normal limits.

Kidneys are within normal limits.  No hydronephrosis.

Stomach/Bowel: Stomach is within normal limits.

Visualized bowel is unremarkable.

Vascular/Lymphatic:  No evidence of abdominal aortic aneurysm.

No suspicious abdominal lymphadenopathy.

Other:  No abdominal ascites.

Musculoskeletal: No focal osseous lesions.
IMPRESSION: 13 mm left adrenal nodule, unchanged from 9805, compatible with a
benign adrenal adenoma.

Moderate to severe hepatic steatosis.

## 2019-06-20 ENCOUNTER — Other Ambulatory Visit: Payer: Self-pay | Admitting: Internal Medicine

## 2019-06-26 ENCOUNTER — Encounter: Payer: Self-pay | Admitting: Family Medicine

## 2019-06-28 ENCOUNTER — Encounter: Payer: Self-pay | Admitting: Family Medicine

## 2019-06-28 ENCOUNTER — Ambulatory Visit: Payer: Managed Care, Other (non HMO) | Admitting: Family Medicine

## 2019-06-28 ENCOUNTER — Other Ambulatory Visit: Payer: Self-pay

## 2019-06-28 VITALS — BP 122/82 | HR 69 | Temp 97.8°F | Resp 14 | Ht 64.0 in | Wt 211.5 lb

## 2019-06-28 DIAGNOSIS — E782 Mixed hyperlipidemia: Secondary | ICD-10-CM

## 2019-06-28 DIAGNOSIS — R7301 Impaired fasting glucose: Secondary | ICD-10-CM | POA: Diagnosis not present

## 2019-06-28 DIAGNOSIS — F411 Generalized anxiety disorder: Secondary | ICD-10-CM | POA: Diagnosis not present

## 2019-06-28 DIAGNOSIS — H6983 Other specified disorders of Eustachian tube, bilateral: Secondary | ICD-10-CM

## 2019-06-28 DIAGNOSIS — J309 Allergic rhinitis, unspecified: Secondary | ICD-10-CM

## 2019-06-28 DIAGNOSIS — Z5181 Encounter for therapeutic drug level monitoring: Secondary | ICD-10-CM

## 2019-06-28 DIAGNOSIS — K760 Fatty (change of) liver, not elsewhere classified: Secondary | ICD-10-CM

## 2019-06-28 DIAGNOSIS — F410 Panic disorder [episodic paroxysmal anxiety] without agoraphobia: Secondary | ICD-10-CM

## 2019-06-28 DIAGNOSIS — I10 Essential (primary) hypertension: Secondary | ICD-10-CM

## 2019-06-28 DIAGNOSIS — Z23 Encounter for immunization: Secondary | ICD-10-CM

## 2019-06-28 DIAGNOSIS — R42 Dizziness and giddiness: Secondary | ICD-10-CM

## 2019-06-28 DIAGNOSIS — I2721 Secondary pulmonary arterial hypertension: Secondary | ICD-10-CM

## 2019-06-28 DIAGNOSIS — H6993 Unspecified Eustachian tube disorder, bilateral: Secondary | ICD-10-CM

## 2019-06-28 DIAGNOSIS — J45998 Other asthma: Secondary | ICD-10-CM

## 2019-06-28 MED ORDER — SUMATRIPTAN SUCCINATE 25 MG PO TABS: 25.0000 mg | ORAL_TABLET | ORAL | 0 refills | Status: DC | PRN

## 2019-06-28 MED ORDER — ESCITALOPRAM OXALATE 10 MG PO TABS: 10.0000 mg | ORAL_TABLET | Freq: Every day | ORAL | 1 refills | Status: DC

## 2019-06-28 MED ORDER — MECLIZINE HCL 12.5 MG PO TABS: 12.5000 mg | ORAL_TABLET | Freq: Three times a day (TID) | ORAL | 0 refills | Status: DC | PRN

## 2019-06-28 MED ORDER — ESCITALOPRAM OXALATE 20 MG PO TABS: 20.0000 mg | ORAL_TABLET | Freq: Every day | ORAL | 1 refills | Status: DC

## 2019-06-28 MED ORDER — PROMETHAZINE HCL 25 MG PO TABS: 25.0000 mg | ORAL_TABLET | Freq: Four times a day (QID) | ORAL | 0 refills | Status: DC | PRN

## 2019-06-28 MED ORDER — HYDROXYZINE HCL 25 MG PO TABS: 25.0000 mg | ORAL_TABLET | Freq: Three times a day (TID) | ORAL | 0 refills | Status: DC | PRN

## 2019-06-28 MED ORDER — EZETIMIBE 10 MG PO TABS: ORAL_TABLET | ORAL | 3 refills | Status: AC

## 2019-06-28 MED ORDER — BUDESONIDE-FORMOTEROL FUMARATE 160-4.5 MCG/ACT IN AERO: 2.0000 | INHALATION_SPRAY | Freq: Two times a day (BID) | RESPIRATORY_TRACT | 11 refills | Status: AC

## 2019-06-28 MED ORDER — ALBUTEROL SULFATE HFA 108 (90 BASE) MCG/ACT IN AERS: 2.0000 | INHALATION_SPRAY | RESPIRATORY_TRACT | 1 refills | Status: AC | PRN

## 2019-06-28 NOTE — Progress Notes (Signed)
Patient ID: Julie Ayala, female    DOB: 10-28-1955, 62 y.o.   MRN: [id number]  PCP: Delsa Grana, PA-C  Chief Complaint  Patient presents with  . Dizziness    ongoing has already seen multiple specialist     Subjective:   Julie Ayala is a 63 y.o. female, presents to clinic with CC of the following:  Dealing with dizziness/vertigo since 08/2018, currently having about 3 episodes/month, vertigo episodes come on very suddenly and they lasts about 2 days with associated nausea.  Usually the day prior to vertigo she experiences a jabbing pain in her ear and a few other vague symptoms but it is usually always followed by vertigo..  Does have associated otalgia and tinnitus.  She describes vertigo as spinning, nausea, loss of balance. Can wake her up sometime in the night, with rolling over.  Other times will occur when she is sitting on the couch not moving at all.  Dizziness This is a chronic problem. The current episode started more than 1 year ago. The problem occurs intermittently (3 times a month). The problem has been unchanged. Associated symptoms include nausea, vertigo and a visual change. Pertinent negatives include no abdominal pain, anorexia, arthralgias, change in bowel habit, chest pain, chills, congestion, coughing, fatigue, fever, headaches, joint swelling, myalgias, neck pain, numbness, rash, sore throat, vomiting or weakness. The symptoms are aggravated by twisting and bending (Any activity after the onset of vertigo episode does exacerbate it she has to put her head down on a table close her eyes and not do anything from [place] 2 days). Treatments tried: She has gone to ENT, tried meclizine 12.5 mg 3 times daily without improvement. The treatment provided no relief.    Did review the chart with the patient including past encounters with her prior PCP, MRI of the brain, we reviewed what she had done with ENT in the past.  Patient Active Problem List   Diagnosis Date Noted  .  GAD (generalized anxiety disorder) 03/14/2019  . Panic attack 03/14/2019  . Asthma, persistent not controlled 06/30/2018  . 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 PAH (pulmonary artery hypertension) ([place]) 12/08/2017  . Polyp of sigmoid colon   . Elevated ferritin 08/26/2017  . Essential hypertension, benign 08/25/2017  . Fatty liver 08/24/2017  . Elevated serum glutamic pyruvic transaminase (SGPT) level 07/19/2017  . Obesity 05/31/2016  . Controlled substance agreement signed 03/14/2016  . Hyperlipidemia   . Sciatica   . OCD (obsessive compulsive disorder)   . Binge eating disorder   . IFG (impaired fasting glucose)   . Insomnia   . 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HCC)       Current Outpatient Medications:  .  albuterol (PROVENTIL HFA;VENTOLIN HFA) 108 (90 Base) MCG/ACT inhaler, Inhale 2 puffs into the lungs every 4 (four) hours as needed for wheezing or shortness of breath., Disp: 1 Inhaler, Rfl: 3 .  budesonide-formoterol (SYMBICORT) 160-4.5 MCG/ACT inhaler, Inhale 2 puffs into the lungs 2 (two) times daily., Disp: 1 Inhaler, Rfl: 11 .  escitalopram (LEXAPRO) 10 MG tablet, Take 1 tablet (10 mg total) by mouth daily., Disp: 90 tablet, Rfl: 1 .  furosemide (LASIX) 40 MG tablet, Take 1 tablet (40 mg total) by mouth daily. *NEEDS OFFICE VISIT FOR FURTHER REFILLS*, Disp: 30 tablet, Rfl: 0 .  hydrOXYzine (ATARAX/VISTARIL) 25 MG tablet, Take 1 tablet (25 mg total) by mouth 3 (three) times daily as needed., Disp: 30 tablet, Rfl: 0 .  meclizine (ANTIVERT) 12.5 MG tablet,  Take 1 tablet (12.5 mg total) by mouth 3 (three) times daily as needed for dizziness., Disp: 30 tablet, Rfl: 0 .  ezetimibe (ZETIA) 10 MG tablet, TAKE 1 TABLET(10 MG) BY MOUTH DAILY (Patient not taking: Reported on 06/28/2019), Disp: 90 tablet, Rfl: 3   Allergies  Allergen Reactions  . Codeine Diarrhea and Nausea And Vomiting  . Pravastatin Other (See Comments)    myalgias  . Latex Rash    bandaids and tape only  . Sulfa  Antibiotics Rash     Family History  Problem Relation Age of Onset  . Hypertension Mother   . Heart disease Mother        pacemaker  . Congestive Heart Failure Mother        with pacemaker  . Stroke Mother        TIA/CVA  . Cancer Mother        lung  . Breast cancer Mother 85  . Cancer Father        colon and lung  . Hypertension Sister   . Mental illness Sister        anxiety  . Hypothyroidism Sister   . Heart disease Brother        unknown heart condition name, EF 55%  . Hypertension Brother   . Cancer Son        melanoma  . Diabetes Paternal Aunt   . Diabetes Paternal Uncle   . Hypertension Brother   . Heart Problems Son        prolonged QT syndrome (syncope)  . Stroke Maternal Grandmother   . COPD Maternal Aunt   . Cirrhosis Maternal Grandfather      Social History   Socioeconomic History  . Marital status: Divorced    Spouse name: Not on file  . Number of children: Not on file  . Years of education: Not on file  . Highest education level: Not on file  Occupational History  . Not on file  Social Needs  . Financial resource strain: Not on file  . Food insecurity    Worry: Not on file    Inability: Not on file  . Transportation needs    Medical: Not on file    Non-medical: Not on file  Tobacco Use  . Smoking status: Former Smoker    Years: 0.50    Types: Cigarettes    Quit date: 03/14/1987    Years since quitting: 32.3  . Smokeless tobacco: Never Used  Substance and Sexual Activity  . Alcohol Use    Alcohol/week: 1.0 standard drinks    Types: 1 Glasses of wine per week    Frequency: Never    Comment: rare  . Drug use: No  . Sexual activity: Not Currently  Lifestyle  . Physical activity    Days per week: Not on file    Minutes per session: Not on file  . Stress: Not on file  Relationships  . Social Herbalist on phone: Not on file    Gets together: Not on file    Attends religious service: Not on file    Active member of club  or organization: Not on file    Attends meetings of clubs or organizations: Not on file    Relationship status: Not on file  . Intimate partner violence    Fear of current or ex partner: Not on file    Emotionally abused: Not on file    Physically abused: Not on file  Forced sexual activity: Not on file  Other Topics Concern  . Not on file  Social History Narrative  . Not on file    I personally reviewed active problem list, medication list, allergies, family history, social history, health maintenance, notes from last encounter, lab results, imaging with the patient/caregiver today.  Review of Systems  Constitutional: Negative.  Negative for activity change, appetite change, chills, fatigue, fever and unexpected weight change.  HENT: Positive for tinnitus. Negative for congestion and sore throat.   Eyes: Negative.   Respiratory: Negative.  Negative for cough, chest tightness and shortness of breath.   Cardiovascular: Negative.  Negative for chest pain, palpitations and leg swelling.  Gastrointestinal: Positive for nausea. Negative for abdominal pain, anorexia, blood in stool, change in bowel habit and vomiting.  Endocrine: Negative.   Genitourinary: Negative.   Musculoskeletal: Negative.  Negative for arthralgias, gait problem, joint swelling, myalgias and neck pain.  Skin: Negative.  Negative for color change, pallor and rash.  Allergic/Immunologic: Negative.   Neurological: Positive for dizziness and vertigo. Negative for syncope, weakness, numbness and headaches.  Hematological: Negative.   Psychiatric/Behavioral: Negative.  Negative for confusion, dysphoric mood, self-injury and suicidal ideas. The patient is not nervous/anxious.        Objective:   Vitals:   06/28/19 1515  BP: 122/82  Pulse: 69  Resp: 14  Temp: 97.8 F (36.6 C)  SpO2: 99%  Weight: 211 lb 8 oz (95.9 kg)  Height: 5\' 4"  (1.626 m)    Body mass index is 36.3 kg/m.  Physical Exam Vitals signs and  nursing note reviewed.  Constitutional:      General: She is not in acute distress.    Appearance: Normal appearance. She is well-developed. She is not ill-appearing, toxic-appearing or diaphoretic.     Interventions: Face mask in place.  HENT:     Head: Normocephalic and atraumatic. No right periorbital erythema or left periorbital erythema.     Right Ear: Hearing, tympanic membrane, ear canal and external ear normal.     Left Ear: Hearing, tympanic membrane, ear canal and external ear normal.     Ears:     Comments: TM bilaterally just slightly dulled, but no erythema, effusion and all landmarks visible    Nose: Mucosal edema and rhinorrhea present.     Right Turbinates: Enlarged and swollen.     Left Turbinates: Enlarged and swollen.     Right Sinus: No maxillary sinus tenderness or frontal sinus tenderness.     Left Sinus: No maxillary sinus tenderness or frontal sinus tenderness.     Mouth/Throat:     Mouth: Mucous membranes are not pale and dry.     Pharynx: Oropharynx is clear. Uvula midline. No pharyngeal swelling, oropharyngeal exudate, posterior oropharyngeal erythema or uvula swelling.     Tonsils: No tonsillar abscesses.  Eyes:     General: Lids are normal. No scleral icterus.       Right eye: No discharge.        Left eye: No discharge.     Conjunctiva/sclera: Conjunctivae normal.     Pupils: Pupils are equal, round, and reactive to light.  Neck:     Musculoskeletal: Normal range of motion and neck supple.     Trachea: Phonation normal. No tracheal deviation.  Cardiovascular:     Rate and Rhythm: Normal rate and regular rhythm.     Pulses: Normal pulses.          Radial pulses are 2+ on the  right side and 2+ on the left side.       Posterior tibial pulses are 2+ on the right side and 2+ on the left side.     Heart sounds: Normal heart sounds. No murmur. No friction rub. No gallop.   Pulmonary:     Effort: Pulmonary effort is normal. No respiratory distress.      Breath sounds: Normal breath sounds. No stridor. No wheezing, rhonchi or rales.  Chest:     Chest wall: No tenderness.  Abdominal:     General: Bowel sounds are normal. There is no distension.     Palpations: Abdomen is soft.     Tenderness: There is no abdominal tenderness. There is no guarding or rebound.  Musculoskeletal: Normal range of motion.        General: No deformity.     Right lower leg: No edema.     Left lower leg: No edema.  Lymphadenopathy:     Cervical: No cervical adenopathy.  Skin:    General: Skin is warm and dry.     Capillary Refill: Capillary refill takes less than 2 seconds.     Coloration: Skin is not jaundiced or pale.     Findings: No rash.  Neurological:     Mental Status: She is alert and oriented to person, place, and time.     Motor: No abnormal muscle tone.     Coordination: Coordination normal.     Gait: Gait normal.     Comments: MENTAL STATUS: AAOx3, memory intact, fund of knowledge appropriate  LANG/SPEECH: Naming and repetition intact, fluent, no dysarthria, follows 3-step commands, answers questions appropriately  CRANIAL NERVES:   II: Pupils equal and reactive, no RAPD   III, IV, VI: EOM intact, no gaze preference or deviation, no nystagmus.   V: normal sensation in V1, V2, and V3 segments bilaterally   VII: no asymmetry, no nasolabial fold flattening   VIII: normal hearing to speech   IX, X: normal palatal elevation, no uvular deviation   XI: 5/5 head turn and 5/5 shoulder shrug bilaterally   XII: midline tongue protrusion  MOTOR:  5/5 bilateral grip strength 5/5 strength dorsiflexion/plantarflexion b/l  SENSORY:  Normal to light touch Romberg absent  COORD: Normal finger to nose and heel to shin, no tremor, no dysmetria  STATION: normal stance, no truncal ataxia  GAIT: Normal; patient able to tip-toe, heel-walk.   Psychiatric:        Speech: Speech normal.        Behavior: Behavior normal.            Assessment &  Plan:      ICD-10-CM   1. 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  Is also had MRI but I have offered to her to refer her to neurology, she wants to wait at this time. It also may be atypical migraine?  The onset and duration and associated symptoms are very similar to migraines.  Treatment-  First she is going to increase meclizine 25 mg 3 times daily and see if it helps.  She can also use Phenergan when she is nauseous  She can also do a trial of  Imitrex to see if when her symptoms began if she is able to prevent a full 2-day episode of vertigo  She is also working on her allergic rhinitis and eustachian tube dysfunction which may be triggering her episodes  consider referring back to ENT?   2. GAD (generalized anxiety disorder)  F41.1 DISCONTINUED: escitalopram (LEXAPRO) 10 MG tablet   Refill her medications, currently anxiety well controlled and no concerning side effects  3. Panic attack  F41.0 hydrOXYzine (ATARAX/VISTARIL) 25 MG tablet   See above  4. IFG (impaired fasting glucose)  R73.01 CMET - [facility]    A1C   History of impaired fasting glucose/prediabetes we will recheck labs today  5. Asthma, persistent not controlled  J45.998 albuterol (VENTOLIN HFA) 108 (90 Base) MCG/ACT inhaler    budesonide-formoterol (SYMBICORT) 160-4.5 MCG/ACT inhaler   Med refills for her asthma not assessed today -but she states its fine, no recent exacerbation  6. Essential hypertension, benign  I10 CMET - [facility]  7. Mixed hyperlipidemia  E78.2 Lipid Panel    CMET - [facility]    ezetimibe (ZETIA) 10 MG tablet  8. Pulmonary artery hypertension (HCC)  I27.21   9. Hepatic  steatosis  K76.0 Lipid Panel    CMET - [facility]   Recheck liver function and cholesterol  10. Encounter for medication monitoring  Z51.81 Lipid Panel    TSH    CBC w/ Diff    CMET - [facility]    A1C  11. Eustachian tube dysfunction, bilateral  H69.83    Treat AR, see below, increase doses to twice daily or add decongestant  12. Allergic rhinitis, unspecified seasonality, unspecified trigger  J30.9    Physical findings of allergic rhinitis, encouraged daily medications to control symptoms  flonase 2 sprays daily and then a antihistamine zyrtec, claritin or allergra - 10 mg at bedtime - do this daily for 2 weeks   13. Need for influenza vaccination  Z23 Flu Vaccine QUAD 6+ mos PF IM (Fluarix Quad PF)    Patient encouraged to follow-up in 1 month if no improvement, sooner if any worsening, and notify us if she would like referrals to go back to ENT or to neurology      Delsa Grana, PA-C 06/28/19 3:20 PM

## 2019-06-28 NOTE — Patient Instructions (Addendum)
Meclizine 25 mg up to three times a day for vertigo  You can try phenergan for when episodes happen or try imitrex at the first symptoms - for nausea and vomiting   Imitrex.  Can use 25-100 mg by mouth once at the onset of headache, and if it doesn't resolve can take a second dose one hour later.  Max dose in 24 hours is 200 mg  For Nose and ears - flonase 2 sprays daily and then a antihistamine zyrtec, claritin or allergra - 10 mg at bedtime - do this daily for 2 weeks

## 2019-07-02 DIAGNOSIS — Z23 Encounter for immunization: Secondary | ICD-10-CM

## 2019-10-01 ENCOUNTER — Other Ambulatory Visit: Payer: Self-pay

## 2019-10-01 ENCOUNTER — Telehealth: Payer: Self-pay | Admitting: Internal Medicine

## 2019-10-01 MED ORDER — FUROSEMIDE 40 MG PO TABS: 40.0000 mg | ORAL_TABLET | Freq: Every day | ORAL | 0 refills | Status: DC

## 2019-10-01 NOTE — Telephone Encounter (Signed)
-----   Message from Janan Ridge, [place] sent at 10/01/2019  3:52 PM EST ----- Regarding: appt needed Patient needs an appointment for further refills. If patient does not want to schedule an appointment please make them aware to contact PCP for refills. I have sent in enough medication until appointment can be made.   Thanks Ladies!

## 2019-10-01 NOTE — Telephone Encounter (Signed)
LVM for patient to schedule.

## 2019-10-02 ENCOUNTER — Other Ambulatory Visit: Payer: Self-pay | Admitting: *Deleted

## 2019-10-02 MED ORDER — FUROSEMIDE 40 MG PO TABS: 40.0000 mg | ORAL_TABLET | Freq: Every day | ORAL | 0 refills | Status: AC

## 2019-10-04 NOTE — Telephone Encounter (Signed)
Attempted to schedule.  No ans no vm  

## 2019-10-08 NOTE — Telephone Encounter (Signed)
No ans no vm   3 attempts to schedule fu appt from recall list.   Deleting recall.

## 2019-10-15 ENCOUNTER — Encounter: Payer: Self-pay | Admitting: Family Medicine

## 2019-10-15 ENCOUNTER — Other Ambulatory Visit: Payer: Self-pay

## 2019-10-15 ENCOUNTER — Ambulatory Visit (INDEPENDENT_AMBULATORY_CARE_PROVIDER_SITE_OTHER): Payer: Managed Care, Other (non HMO) | Admitting: Family Medicine

## 2019-10-15 VITALS — Ht 64.0 in | Wt 194.0 lb

## 2019-10-15 DIAGNOSIS — J4530 Mild persistent asthma, uncomplicated: Secondary | ICD-10-CM | POA: Diagnosis not present

## 2019-10-15 DIAGNOSIS — F320 Major depressive disorder, single episode, mild: Secondary | ICD-10-CM | POA: Diagnosis not present

## 2019-10-15 DIAGNOSIS — F411 Generalized anxiety disorder: Secondary | ICD-10-CM | POA: Diagnosis not present

## 2019-10-15 DIAGNOSIS — E782 Mixed hyperlipidemia: Secondary | ICD-10-CM | POA: Diagnosis not present

## 2019-10-15 DIAGNOSIS — R7301 Impaired fasting glucose: Secondary | ICD-10-CM

## 2019-10-15 DIAGNOSIS — R42 Dizziness and giddiness: Secondary | ICD-10-CM

## 2019-10-15 MED ORDER — ESCITALOPRAM OXALATE 20 MG PO TABS: 20.0000 mg | ORAL_TABLET | Freq: Every day | ORAL | 3 refills | Status: AC

## 2019-10-15 NOTE — Progress Notes (Signed)
Name: Timara Dohner   MRN: [id number]    DOB: 11-04-1955   Date:10/15/2019       Progress Note  Subjective:    I connected with  Mauri Brooklyn  on 10/15/19 at  3:40 PM EST by a video enabled telemedicine application and verified that I am speaking with the correct person using two identifiers.  I discussed the limitations of evaluation and management by telemedicine and the availability of in person appointments. The patient expressed understanding and agreed to proceed. Staff also discussed with the patient that there may be a patient responsible charge related to this service. Patient Location: home - new place in [place] Provider Location: [facility] Additional Individuals present: none  Chief Complaint  Patient presents with  . Dizziness  . Hyperlipidemia  . Hypertension    Charolette Hardister is a 64 y.o. female, presents for virtual visit for routine follow up on the conditions listed above.  For weeks ago moved to [place], she presents for f/up  Though she intends to establish care locally, she just doesn't know how long that will take, and she may possibly come to [place] area a few times a year and stay est with practice - she's not sure yet.   MDDanxiety/panic attacks hx of binge eating disorder, insomnia and OCD  - on lexapro 20 mg, still working well feels moods feel good and meds are working well, not feeling depressed, PHQ9 is neg Depression screen [facility] 2/9 10/15/2019 06/28/2019 04/16/2019  Decreased Interest 0 0 0  Down, Depressed, Hopeless 0 0 0  PHQ - 2 Score 0 0 0  Altered sleeping 0 0 0  Tired, decreased energy 0 0 0  Change in appetite 0 0 0  Feeling bad or failure about yourself  0 0 0  Trouble concentrating 0 0 0  Moving slowly or fidgety/restless 0 0 0  Suicidal thoughts 0 0 0  PHQ-9 Score 0 0 0  Difficult doing work/chores Not difficult at all Not difficult at all Not difficult at all  Some recent data might be hidden   On zetia - and seeing cardiology  -  pt states she was discharged by cardiology - they had been refilling lasix for her, and in the last month sent in 2 week supply with note that pt needs f/up appt prior to any more refills.  HA/ear pain/vertigo episodes - saw for it about 3-4 months ago, she was given imitrex, meclizine but did not try it, she worked on diet changed, she researched a lot of info about vertigo and she cut out cheeses and sour creams and went to a chiropractor, she states she has had improvement with less episodes, less severe.  She did not want to use the meds if she didn't need them.  She did not f/up with neuro.    She previously went to Dr. Moyiarty/endocrinology - last labs available in chart  09/2018 -CBC was grossly normal, chemistry showed mildly elevated glucose, normal GFR and electrolytes carbon dioxide was slightly below normal, liver function was also unremarkable, Thyroid labs TSH 3.660, her lipid panel was very elevated - reviewed media images/scanned in records today with pt, last labs done  Total cholesterol 261 H Triglycerides  285 H HDL   39 L VLDL   57 H LDL   165 H LDL/HDL ratio  4.2 high HLD:  Pt on zetia only, did not tolerate pravastatin in the past due to myalgias, unknown if she tried other statin meds Lab  Results  Component Value Date   CHOL 223 (H) 01/10/2018   HDL 45 01/10/2018   LDLCALC 131 (H) 01/10/2018   TRIG 233 (H) 01/10/2018   CHOLHDL 5.0 (H) 01/10/2018  The 10-year ASCVD risk score Mikey Bussing DC Jr., et al., 2013) is: 5.5%   Values used to calculate the score:     Age: 56 years     Sex: Female     Is Non-Hispanic African American: No     Diabetic: No     Tobacco smoker: No     Systolic Blood Pressure: 123XX123 mmHg     Is BP treated: Yes     HDL Cholesterol: 45 mg/dL     Total Cholesterol: 223 mg/dL  Labs from last visit were never completed, active orders for CPE include TSH, A1C, lipid panel, CMP and CBC - she does have [facility] near her, encouraged her to get labs completed,  I offered to mail her the lab req, but she declined.  She is going to look for new PCP in the area, but wanted to get med refills in case she couldn't establish quickly, she didn't want to run out  Asthma - she states it is well controlled on symbicort and SABA, last visit 3 months ago she was given year supply of meds        Patient Active Problem List   Diagnosis Date Noted  . GAD (generalized anxiety disorder) 03/14/2019  . Panic attack 03/14/2019  . Asthma, persistent not controlled 06/30/2018  . 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 PAH (pulmonary artery hypertension) ([place]) 12/08/2017  . Polyp of sigmoid colon   . Elevated ferritin 08/26/2017  . Essential hypertension, benign 08/25/2017  . Fatty liver 08/24/2017  . Elevated serum glutamic pyruvic transaminase (SGPT) level 07/19/2017  . Obesity 05/31/2016  . Controlled substance agreement signed 03/14/2016  . Hyperlipidemia   . Sciatica   . OCD (obsessive compulsive disorder)   . Binge eating disorder   . IFG (impaired fasting glucose)   . Insomnia   . 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HCC)     Current Outpatient Medications:  .  albuterol (VENTOLIN HFA) 108 (90 Base) MCG/ACT inhaler, Inhale 2 puffs into the lungs every 4 (four) hours as needed for wheezing or shortness of breath., Disp: 18 g, Rfl: 1 .  budesonide-formoterol (SYMBICORT) 160-4.5 MCG/ACT inhaler, Inhale 2 puffs into the lungs 2 (two) times daily., Disp: 1 Inhaler, Rfl: 11 .  escitalopram (LEXAPRO) 20 MG tablet, Take 1 tablet (20 mg total) by mouth daily., Disp: 90 tablet, Rfl: 1 .  ezetimibe (ZETIA) 10 MG tablet, TAKE 1 TABLET(10 MG) BY MOUTH DAILY, Disp: 90 tablet, Rfl: 3 .  furosemide (LASIX) 40 MG tablet, Take 1 tablet (40 mg total) by mouth daily. *NEEDS OFFICE VISIT FOR FURTHER REFILLS*, Disp: 15 tablet, Rfl: 0 .  hydrOXYzine (ATARAX/VISTARIL) 25 MG tablet, Take 1 tablet (25 mg total) by mouth 3 (three) times daily as needed., Disp: 30 tablet, Rfl: 0 .   meclizine (ANTIVERT) 12.5 MG tablet, Take 1 tablet (12.5 mg total) by mouth 3 (three) times daily as needed for dizziness., Disp: 30 tablet, Rfl: 0 .  promethazine (PHENERGAN) 25 MG tablet, Take 1 tablet (25 mg total) by mouth every 6 (six) hours as needed for nausea or vomiting., Disp: 30 tablet, Rfl: 0 .  SUMAtriptan (IMITREX) 25 MG tablet, Take 1 tablet (25 mg total) by mouth every 2 (two) hours as needed for migraine. Maximum daily dose 200mg , Disp: 30  tablet, Rfl: 0 Allergies  Allergen Reactions  . Codeine Diarrhea and Nausea And Vomiting  . Pravastatin Other (See Comments)    myalgias  . Latex Rash    bandaids and tape only  . Sulfa Antibiotics Rash    Past Surgical History:  Procedure Laterality Date  . BREAST BIOPSY Right 2010   stereo core bx- neg  . CHOLECYSTECTOMY  2013  . COLONOSCOPY  06/2012   small polyps, repeat in 06/2016  . COLONOSCOPY WITH PROPOFOL N/A 09/09/2017   Procedure: COLONOSCOPY WITH PROPOFOL;  Surgeon: Lucilla Lame, MD;  Location: [place];  Service: Endoscopy;  Laterality: N/A;  . CYSTECTOMY     uterine cyst removed  . CYSTECTOMY     Hyoid cyst removed  . DIAGNOSTIC LAPAROSCOPY     endometrosis  . GANGLION CYST EXCISION Right 08/07/2018   Procedure: REMOVAL GANGLION CYST FOOT;  Surgeon: Earnestine Leys, MD;  Location: [facility];  Service: Orthopedics;  Laterality: Right;  . MELANOMA EXCISION    . POLYPECTOMY  09/09/2017   Procedure: POLYPECTOMY;  Surgeon: Lucilla Lame, MD;  Location: [place];  Service: Endoscopy;;   Family History  Problem Relation Age of Onset  . Hypertension Mother   . Heart disease Mother        pacemaker  . Congestive Heart Failure Mother        with pacemaker  . Stroke Mother        TIA/CVA  . Cancer Mother        lung  . Breast cancer Mother 56  . Cancer Father        colon and lung  . Hypertension Sister   . Mental illness Sister        anxiety  . Hypothyroidism Sister   . Heart disease Brother         unknown heart condition name, EF 55%  . Hypertension Brother   . Cancer Son        melanoma  . Diabetes Paternal Aunt   . Diabetes Paternal Uncle   . Hypertension Brother   . Heart Problems Son        prolonged QT syndrome (syncope)  . Stroke Maternal Grandmother   . COPD Maternal Aunt   . Cirrhosis Maternal Grandfather    Social History   Socioeconomic History  . Marital status: Divorced    Spouse name: Not on file  . Number of children: Not on file  . Years of education: Not on file  . Highest education level: Not on file  Occupational History  . Not on file  Tobacco Use  . Smoking status: Former Smoker    Years: 0.50    Types: Cigarettes    Quit date: 03/14/1987    Years since quitting: 32.6  . Smokeless tobacco: Never Used  Substance and Sexual Activity  . Alcohol use: Not on file    Comment: rare  . Drug use: No  . Sexual activity: Not Currently  Other Topics Concern  . Not on file  Social History Narrative  . Not on file   Social Determinants of Health   Financial Resource Strain:   . Difficulty of Paying Living Expenses: Not on file  Food Insecurity:   . Worried About Charity fundraiser in the Last Year: Not on file  . Ran Out of Food in the Last Year: Not on file  Transportation Needs:   . Lack of Transportation (Medical): Not on file  . Lack of  Transportation (Non-Medical): Not on file  Physical Activity:   . Days of Exercise per Week: Not on file  . Minutes of Exercise per Session: Not on file  Stress:   . Feeling of Stress : Not on file  Social Connections:   . Frequency of Communication with Friends and Family: Not on file  . Frequency of Social Gatherings with Friends and Family: Not on file  . Attends Religious Services: Not on file  . Active Member of Clubs or Organizations: Not on file  . Attends Archivist Meetings: Not on file  . Marital Status: Not on file  Intimate Partner Violence:   . Fear of Current or Ex-Partner:  Not on file  . Emotionally Abused: Not on file  . Physically Abused: Not on file  . Sexually Abused: Not on file    Chart Review Today: I personally reviewed active problem list, medication list, allergies, family history, social history, health maintenance, notes from last encounter, lab results, imaging with the patient/caregiver today. More than 10 min spent today reviewing last OV here, prior labs with multiple specialists, PCP, past meds prescribed, SE/allergies etc.  Review of Systems  10 Systems reviewed and are negative for acute change except as noted in the HPI.   Objective:    Virtual encounter, vitals limited, only able to obtain the following Today's Vitals   10/15/19 1514 10/15/19 1515  Weight: 194 lb (88 kg)   Height: 5\' 4"  (1.626 m)   PainSc:  1    Body mass index is 33.3 kg/m. Nursing Note and Vital Signs reviewed.  Physical Exam Alert, obese, well appearing, speech clear, normal phonation, no facial droop/appears symmetrical  NAD, normal respirations PE limited by telephone encounter  No results found for this or any previous visit (from the past 72 hour(s)).  PHQ2/9: Depression screen [facility] 2/9 10/15/2019 06/28/2019 04/16/2019 03/14/2019 03/01/2019  Decreased Interest 0 0 0 0 0  Down, Depressed, Hopeless 0 0 0 0 0  PHQ - 2 Score 0 0 0 0 0  Altered sleeping 0 0 0 0 0  Tired, decreased energy 0 0 0 0 0  Change in appetite 0 0 0 0 0  Feeling bad or failure about yourself  0 0 0 0 0  Trouble concentrating 0 0 0 0 0  Moving slowly or fidgety/restless 0 0 0 0 0  Suicidal thoughts 0 0 0 0 0  PHQ-9 Score 0 0 0 0 0  Difficult doing work/chores Not difficult at all Not difficult at all Not difficult at all Not difficult at all Not difficult at all  Some recent data might be hidden   PHQ-2/9 Result is neg, rev  Fall Risk: Fall Risk  10/15/2019 06/28/2019 04/16/2019 03/14/2019 03/01/2019  Falls in the past year? 0 0 0 0 0  Number falls in past yr: 0 0 0 0 0  Injury  with Fall? 0 0 0 0 0  Comment - - - - -     Assessment and Plan:     ICD-10-CM   1. GAD (generalized anxiety disorder)  F41.1 escitalopram (LEXAPRO) 20 MG tablet   she reports sx are stable/well controlled and no concerning side effects, would like refills so she doesn't run out  2. Mixed hyperlipidemia  E78.2    very high cholesterol, LDL, trigs, and low HDL, on zetia, unclear if statin intolerant?  encouraged her to repeat labs - reviewed 09/2018 labs very high  3. Mild persistent asthma without complication  J45.30    currently well controlled with symbicort and SABA  4. Current mild episode of major depressive disorder, unspecified whether recurrent ([place])  F32.0    well controlled with lexapro  5. Vertigo  R42    improving sx with some diet changes and with seeing chiropractor - all meds previously prescribed not tried, removed from chart  6. IFG (impaired fasting glucose)  R73.01    labs pending for recheck     Past OV with me - with vertigo, asthma, ENT dx omitted-  still due to the following labs and monitoring of Dx  4. IFG (impaired fasting glucose)  R73.01 [facility] - [facility]    A1C   History of impaired fasting glucose/prediabetes we will recheck labs today  6. Essential hypertension, benign  I10 [facility] - [facility]  7. Mixed hyperlipidemia  E78.2 Lipid Panel    [facility] - [facility]    ezetimibe (ZETIA) 10 MG tablet  8. Pulmonary artery hypertension (HCC)  I27.21   9. Hepatic steatosis  K76.0 Lipid Panel    [facility] - [facility]   Recheck liver function and cholesterol  10. Encounter for medication monitoring  Z51.81 Lipid Panel    TSH    CBC w/ Diff    [facility] - [facility]    A1C   Med list updated to reflect what pt is currently taking     I discussed the assessment and treatment plan with the patient. The patient was provided an opportunity to ask questions and all were answered. The patient agreed with the plan and demonstrated an understanding of the  instructions.  The patient was advised to call back or seek an in-person evaluation if the symptoms worsen or if the condition fails to improve as anticipated.  I provided 30+ minutes of non-face-to-face time during this encounter  Greater than 50% of this visit was spent in non-face-to-face time spent  counseling, obtaining history and physical, discussing and educating pt on treatment plan.    Remainder of time involved but was not limited to reviewing chart (recent and pertinent OV notes and labs), documentation in EMR, and coordinating care and treatment plan.   Delsa Grana, PA-C 10/15/19 4:16 PM

## 2019-12-03 ENCOUNTER — Encounter: Payer: Self-pay | Admitting: Family Medicine

## 2019-12-24 ENCOUNTER — Telehealth: Payer: Self-pay | Admitting: Internal Medicine

## 2019-12-24 NOTE — Telephone Encounter (Signed)
Attempted to schedule no ans no vm  

## 2019-12-24 NOTE — Telephone Encounter (Signed)
-----   Message from Anselm Pancoast, [place] sent at 12/21/2019  5:09 PM EDT ----- Please contact patient for a follow up appointment with Dr. Saunders Revel. Thanks, Ivin Booty

## 2019-12-27 ENCOUNTER — Encounter: Payer: Self-pay | Admitting: Family Medicine

## 2019-12-31 NOTE — Telephone Encounter (Signed)
LMOM to schedule.

## 2020-01-01 ENCOUNTER — Other Ambulatory Visit: Payer: Self-pay | Admitting: Family Medicine

## 2020-01-01 DIAGNOSIS — E782 Mixed hyperlipidemia: Secondary | ICD-10-CM

## 2020-01-03 ENCOUNTER — Encounter: Payer: Self-pay | Admitting: Family Medicine

## 2020-01-08 NOTE — Telephone Encounter (Signed)
LVM for patient to call and schedule fu 

## 2020-02-01 ENCOUNTER — Encounter: Payer: Self-pay | Admitting: Internal Medicine

## 2020-02-01 NOTE — Telephone Encounter (Signed)
Unable to reach patient . Closing encounter and mailing a letter.

## 2020-05-16 ENCOUNTER — Encounter: Payer: Self-pay | Admitting: Internal Medicine

## 2020-05-22 NOTE — Progress Notes (Signed)
Patient ID: Julie Ayala, female    DOB: Nov 07, 1955, 64 y.o.   MRN: [id number]  PCP: Delsa Grana, PA-C  Chief Complaint  Patient presents with  . Follow-up    Subjective:   Julie Ayala is a 64 y.o. female, presents to clinic with CC of the following:  Chief Complaint  Patient presents with  . Follow-up    HPI:  Patient is a 64 year old female patient of Delsa Grana Last visit with her was 10/15/2019  Follows up today to have a form completed for [employer] appealing the BMI requirement   She sent a message on 05/16/2020 as follows:  Good morning. I'm sch for 10/8 to review my Health Assessment and to have the appeal form for BMI completed.  My records will show that I've been follow by the practice for an eating disorder. Binge eating.  This is an annual requirement, but didn't take place in 2020 due to covid.  Dr Sanda Klein attended to this each year.   She sent this message 12/27/2019 as well.  Good morning,  Dr. Sanda Klein treated me for eating disorder and has completed for me annually a form that I submit to my employer that allows me to not pay a higher premium because of my BMI.  There will be a DX and previous forms within my chart. I didn't have to submit last year due to covid restrictions. There was no annual health screening held by my company.  I will have to do a health screening this year.  Will you need me to schedule an appointment to discuss this. I don't even know what I weigh as I will mostly not look at a scale, and I ask that health providers not tell me my weight.  I've reduced a size since last year, and I continue to walk and swim. It's frustrating to say the least. 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  The health screening begin next month and go through August, so I wanted to get in front of this since I have a new provider.  Thank you.  At that last visit with [facility], it was noted that four weeks ago, she moved to [place] and though she intends  to establish care locally, she just doesn't know how long that will take, and she may possibly come to [place] area a few times a year and stay est with this practice.  She notes she has not really settled into seeing providers in her relocation area, and still works with [employer] and is in this area intermittently.  She has a history of MDDanxiety/panic attacks hx of binge eating disorder, insomnia and OCD  - on lexapro 20 mg, not feeling depressed, PHQ is neg  Hyperlipidemia-on zetia , did not tolerate pravastatin in the past due to myalgias noted was seeing cardiology previously, not in the very recent past  She notes today she did have her labs done recently through [facility], and noted there really was not any major changes.  I did not have a copy of them to look at today.  Labs from previous were noted to be as follows 09/2018 -CBC was grossly normal, chemistry showed mildly elevated glucose, normal GFR and electrolytes carbon dioxide was slightly below normal, liver function was also unremarkable, Thyroid labs TSH 3.660, her lipid panel was very elevated - reviewed media images/scanned in records today with pt, last labs done  Total cholesterol  261 H Triglycerides                285 H HDL                             39 L VLDL                           57 H LDL                              165 H LDL/HDL ratio             4.2 high  Vertigo -she noted today she has an occasional vertigo still, and saw many specialists as part of that evaluation, was told she had a benign adrenaline tumor, and she notes that since she changed her diet, it has been significantly improved.  Food intolerance-she noted she would like to get the alpha gal test, as she was struggling with some vomiting in the morning, and after vomiting she would then feel okay, and over time, realized it was after eating meat products the night before.  She has stopped red meat in the last 90 days, and has had no  further symptoms.  She would like to get an alpha gal test, and I do not feel that is unreasonable.  I did note that if that returns positive, the treatment is really avoidance of meat products as she is doing.  She notes she is trying to eat healthy, and is exercising regularly, and when noted the goal of 5 times a week of at least 30 minutes, she states she often gets more than that on a weekly basis.  Has not had much success with weight loss in the recent past.  Her weight was better from November 2020, and noted up a little since March.  Wt Readings from Last 3 Encounters:  05/23/20 201 lb 11.2 oz (91.5 kg)  10/15/19 194 lb (88 kg)  06/28/19 211 lb 8 oz (95.9 kg)     Patient Active Problem List   Diagnosis Date Noted  . GAD (generalized anxiety disorder) 03/14/2019  . Panic attack 03/14/2019  . Asthma, persistent not controlled 06/30/2018  . 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 PAH (pulmonary artery hypertension) ([place]) 12/08/2017  . Polyp of sigmoid colon   . Elevated ferritin 08/26/2017  . Essential hypertension, benign 08/25/2017  . Fatty liver 08/24/2017  . Elevated serum glutamic pyruvic transaminase (SGPT) level 07/19/2017  . Obesity 05/31/2016  . Controlled substance agreement signed 03/14/2016  . Hyperlipidemia   . Sciatica   . OCD (obsessive compulsive disorder)   . Binge eating disorder   . IFG (impaired fasting glucose)   . Insomnia   . 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HCC)       Current Outpatient Medications:  .  albuterol (VENTOLIN HFA) 108 (90 Base) MCG/ACT inhaler, Inhale 2 puffs into the lungs every 4 (four) hours as needed for wheezing or shortness of breath., Disp: 18 g, Rfl: 1 .  aspirin 81 MG EC tablet, aspirin 81 mg tablet,delayed release, Disp: , Rfl:  .  budesonide-formoterol (SYMBICORT) 160-4.5 MCG/ACT inhaler, Inhale 2 puffs into the lungs 2 (two) times daily., Disp: 1 Inhaler, Rfl: 11 .  furosemide (LASIX) 40 MG tablet, Take 1 tablet (40 mg total) by  mouth daily. *NEEDS OFFICE VISIT FOR FURTHER  REFILLS*, Disp: 15 tablet, Rfl: 0 .  escitalopram (LEXAPRO) 20 MG tablet, Take 1 tablet (20 mg total) by mouth daily. (Patient not taking: Reported on 05/23/2020), Disp: 90 tablet, Rfl: 3 .  ezetimibe (ZETIA) 10 MG tablet, TAKE 1 TABLET(10 MG) BY MOUTH DAILY (Patient not taking: Reported on 05/23/2020), Disp: 90 tablet, Rfl: 3   Allergies  Allergen Reactions  . Codeine Diarrhea and Nausea And Vomiting  . Pravastatin Other (See Comments)    myalgias  . Latex Rash    bandaids and tape only  . Sulfa Antibiotics Rash     Past Surgical History:  Procedure Laterality Date  . BREAST BIOPSY Right 2010   stereo core bx- neg  . CHOLECYSTECTOMY  2013  . COLONOSCOPY  06/2012   small polyps, repeat in 06/2016  . COLONOSCOPY WITH PROPOFOL N/A 09/09/2017   Procedure: COLONOSCOPY WITH PROPOFOL;  Surgeon: Lucilla Lame, MD;  Location: [place];  Service: Endoscopy;  Laterality: N/A;  . CYSTECTOMY     uterine cyst removed  . CYSTECTOMY     Hyoid cyst removed  . DIAGNOSTIC LAPAROSCOPY     endometrosis  . GANGLION CYST EXCISION Right 08/07/2018   Procedure: REMOVAL GANGLION CYST FOOT;  Surgeon: Earnestine Leys, MD;  Location: [facility];  Service: Orthopedics;  Laterality: Right;  . MELANOMA EXCISION    . POLYPECTOMY  09/09/2017   Procedure: POLYPECTOMY;  Surgeon: Lucilla Lame, MD;  Location: [place];  Service: Endoscopy;;     Family History  Problem Relation Age of Onset  . Hypertension Mother   . Heart disease Mother        pacemaker  . Congestive Heart Failure Mother        with pacemaker  . Stroke Mother        TIA/CVA  . Cancer Mother        lung  . Breast cancer Mother 27  . Cancer Father        colon and lung  . Hypertension Sister   . Mental illness Sister        anxiety  . Hypothyroidism Sister   . Heart disease Brother        unknown heart condition name, EF 55%  . Hypertension Brother   . Cancer Son         melanoma  . Diabetes Paternal Aunt   . Diabetes Paternal Uncle   . Hypertension Brother   . Heart Problems Son        prolonged QT syndrome (syncope)  . Stroke Maternal Grandmother   . COPD Maternal Aunt   . Cirrhosis Maternal Grandfather      Social History   Tobacco Use  . Smoking status: Former Smoker    Years: 0.50    Types: Cigarettes    Quit date: 03/14/1987    Years since quitting: 33.2  . Smokeless tobacco: Never Used  Substance Use Topics  . Alcohol use: Not on file    Comment: rare    With staff assistance, above reviewed with the patient today.  ROS: As per HPI, otherwise no specific complaints on a limited and focused system review   No results found for this or any previous visit (from the past 72 hour(s)).   PHQ2/9: Depression screen [facility] 2/9 05/23/2020 10/15/2019 06/28/2019 04/16/2019 03/14/2019  Decreased Interest 0 0 0 0 0  Down, Depressed, Hopeless 0 0 0 0 0  PHQ - 2 Score 0 0 0 0 0  Altered sleeping -  0 0 0 0  Tired, decreased energy - 0 0 0 0  Change in appetite - 0 0 0 0  Feeling bad or failure about yourself  - 0 0 0 0  Trouble concentrating - 0 0 0 0  Moving slowly or fidgety/restless - 0 0 0 0  Suicidal thoughts - 0 0 0 0  PHQ-9 Score - 0 0 0 0  Difficult doing work/chores - Not difficult at all Not difficult at all Not difficult at all Not difficult at all  Some recent data might be hidden   PHQ-2/9 Result is neg  Fall Risk: Fall Risk  05/23/2020 10/15/2019 06/28/2019 04/16/2019 03/14/2019  Falls in the past year? 0 0 0 0 0  Number falls in past yr: 0 0 0 0 0  Injury with Fall? 0 0 0 0 0  Comment - - - - -      Objective:   Vitals:   05/23/20 1458  BP: 140/76  Pulse: 80  Resp: 16  Temp: 98.4 F (36.9 C)  TempSrc: Oral  SpO2: 99%  Weight: 201 lb 11.2 oz (91.5 kg)  Height: 5\' 4"  (1.626 m)    Body mass index is 34.62 kg/m.  Physical Exam   NAD, masked, pleasant HEENT - [place]/AT, sclera anicteric, PERRL, EOMI, conj - non-inj'ed,  pharynx clear Neck - supple, no adenopathy, carotids 2+ and equal without bruits Car - RRR without m/g/r Pulm- RR and effort normal at rest, CTA without wheeze or rales Abd - soft, NT diffusely, mildly obese, ND,  Back - no CVA tenderness Ext - no LE edema,  Neuro/psychiatric - affect was not flat, appropriate with conversation  Alert and oriented  Grossly non-focal   Speech normal   Results for orders placed or performed in visit on 09/32/35  Basic metabolic panel  Result Value Ref Range   Glucose 107    Creatinine 0.8 0.5 - 1.1  Lipid panel  Result Value Ref Range   Triglycerides 347 (A) 40 - 160   Cholesterol 204 (A) 0 - 200   HDL 46 35 - 70   LDL Cholesterol 89   Hemoglobin A1c  Result Value Ref Range   Hemoglobin A1C 5.9    Last labs above from May reviewed,    Assessment & Plan:    1. Need for immunization against influenza  - Flu Vaccine QUAD 36+ mos IM  2. Food intolerance Okay to obtain an alpha gal test, and ordered through [facility].  She will obtain that outside through [facility]  3. Mixed hyperlipidemia Cholesterol mildly elevated triglycerides still elevated, and is on Zetia. She did not tolerate a statin in the past. Continues to try to manage with dietary modifications in combination.  4. Binge eating disorder Noted a history of this, and in the past that she had the appeal form completed due to concerns with this history.  Did review the prior form completed by Dr. Jacinto Reap in the past. Agreed with this approach, and completed the form again today for her. A copy was made for the chart.  Noted best not to follow weights or BMIs, but rather increasing physical activity levels, she notes she has been trying to be more active, and has been meeting the goals noted on the form completed today.  Encouraged continued regular exercise and the importance of that in combination with dietary modifications.  5. GAD (generalized anxiety disorder) Is  on Lexapro presently.  6. Class 1 obesity due  to excess calories without serious comorbidity with body mass index (BMI) of 34.0 to 34.9 in adult As above noted, did feel appropriate to complete the appeal form, and emphasized the importance of continuing to work on healthy weight maintenance that includes dietary modifications and regular physical activity.  She has not had the Covid vaccine as of yet, although did agree to get the flu vaccine today.   Towanda Malkin, MD 05/23/20 3:11 PM

## 2020-05-23 ENCOUNTER — Other Ambulatory Visit: Payer: Self-pay

## 2020-05-23 ENCOUNTER — Encounter: Payer: Self-pay | Admitting: Internal Medicine

## 2020-05-23 ENCOUNTER — Ambulatory Visit: Payer: Managed Care, Other (non HMO) | Admitting: Internal Medicine

## 2020-05-23 VITALS — BP 140/76 | HR 80 | Temp 98.4°F | Resp 16 | Ht 64.0 in | Wt 201.7 lb

## 2020-05-23 DIAGNOSIS — Z23 Encounter for immunization: Secondary | ICD-10-CM

## 2020-05-23 DIAGNOSIS — K9049 Malabsorption due to intolerance, not elsewhere classified: Secondary | ICD-10-CM

## 2020-05-23 DIAGNOSIS — E782 Mixed hyperlipidemia: Secondary | ICD-10-CM | POA: Diagnosis not present

## 2020-05-23 DIAGNOSIS — F411 Generalized anxiety disorder: Secondary | ICD-10-CM | POA: Diagnosis not present

## 2020-05-23 DIAGNOSIS — F50819 Binge eating disorder, unspecified: Secondary | ICD-10-CM

## 2020-05-23 DIAGNOSIS — E6609 Other obesity due to excess calories: Secondary | ICD-10-CM

## 2020-05-23 DIAGNOSIS — Z6834 Body mass index (BMI) 34.0-34.9, adult: Secondary | ICD-10-CM

## 2020-05-23 DIAGNOSIS — F5081 Binge eating disorder: Secondary | ICD-10-CM

## 2020-05-23 NOTE — Patient Instructions (Signed)

## 2020-06-03 ENCOUNTER — Encounter: Payer: Self-pay | Admitting: Internal Medicine

## 2020-06-07 LAB — ALPHA-GAL PANEL
Alpha Gal IgE*: <0.1 kU/L (ref <0.10)
Beef (Bos spp) IgE: <0.1 kU/L (ref <0.35)
Class Interpretation: 0
Class Interpretation: 0
Class Interpretation: 0
Lamb/Mutton (Ovis spp) IgE: <0.1 kU/L (ref <0.35)
Pork (Sus spp) IgE: <0.1 kU/L (ref <0.35)

## 2020-09-15 ENCOUNTER — Other Ambulatory Visit: Payer: Self-pay

## 2020-09-15 DIAGNOSIS — E782 Mixed hyperlipidemia: Secondary | ICD-10-CM
